# Patient Record
Sex: Male | Born: 1980 | Race: White | Hispanic: No | Marital: Married | Smoking: Never smoker
Health system: Southern US, Community
[De-identification: ages and names within clinical notes are randomized; demographics above are authoritative.]

## PROBLEM LIST (undated history)

## (undated) DIAGNOSIS — R701 Abnormal plasma viscosity: Secondary | ICD-10-CM

## (undated) DIAGNOSIS — Z8619 Personal history of other infectious and parasitic diseases: Secondary | ICD-10-CM

## (undated) DIAGNOSIS — N200 Calculus of kidney: Secondary | ICD-10-CM

## (undated) DIAGNOSIS — K760 Fatty (change of) liver, not elsewhere classified: Secondary | ICD-10-CM

## (undated) DIAGNOSIS — R17 Unspecified jaundice: Secondary | ICD-10-CM

## (undated) DIAGNOSIS — Z87442 Personal history of urinary calculi: Secondary | ICD-10-CM

## (undated) HISTORY — PX: WISDOM TOOTH EXTRACTION: SHX21

## (undated) HISTORY — DX: Calculus of kidney: N20.0

## (undated) HISTORY — DX: Abnormal plasma viscosity: R70.1

## (undated) HISTORY — DX: Personal history of other infectious and parasitic diseases: Z86.19

## (undated) HISTORY — DX: Fatty (change of) liver, not elsewhere classified: K76.0

## (undated) HISTORY — DX: Unspecified jaundice: R17

---

## 2008-02-09 HISTORY — PX: CYSTOSCOPY W/ URETEROSCOPY W/ LITHOTRIPSY: SUR380

## 2013-02-08 DIAGNOSIS — N200 Calculus of kidney: Secondary | ICD-10-CM

## 2013-02-08 HISTORY — DX: Calculus of kidney: N20.0

## 2013-07-05 ENCOUNTER — Emergency Department: Payer: Self-pay | Admitting: Emergency Medicine

## 2013-07-05 LAB — COMPREHENSIVE METABOLIC PANEL
Albumin: 4.5 g/dL (ref 3.4–5.0)
Alkaline Phosphatase: 81 U/L
Anion Gap: 8 (ref 7–16)
BUN: 12 mg/dL (ref 7–18)
Bilirubin,Total: 1.8 mg/dL — ABNORMAL HIGH (ref 0.2–1.0)
Calcium, Total: 9.6 mg/dL (ref 8.5–10.1)
Chloride: 104 mmol/L (ref 98–107)
Co2: 25 mmol/L (ref 21–32)
Creatinine: 0.87 mg/dL (ref 0.60–1.30)
EGFR (African American): >60
EGFR (Non-African Amer.): >60
Glucose: 115 mg/dL — ABNORMAL HIGH (ref 65–99)
Osmolality: 274 (ref 275–301)
Potassium: 4.1 mmol/L (ref 3.5–5.1)
SGOT(AST): 46 U/L — ABNORMAL HIGH (ref 15–37)
SGPT (ALT): 83 U/L — ABNORMAL HIGH (ref 12–78)
Sodium: 137 mmol/L (ref 136–145)
Total Protein: 8.2 g/dL (ref 6.4–8.2)

## 2013-07-05 LAB — URINALYSIS, COMPLETE
Bacteria: NONE SEEN
Bilirubin,UR: NEGATIVE
Glucose,UR: NEGATIVE mg/dL (ref 0–75)
Ketone: NEGATIVE
Leukocyte Esterase: NEGATIVE
Nitrite: NEGATIVE
Ph: 9 (ref 4.5–8.0)
Protein: 30
RBC,UR: 2464 /HPF (ref 0–5)
Specific Gravity: 1.018 (ref 1.003–1.030)
Squamous Epithelial: NONE SEEN
WBC UR: 6 /HPF (ref 0–5)

## 2013-07-05 LAB — CBC
HCT: 46.1 % (ref 40.0–52.0)
HGB: 15.3 g/dL (ref 13.0–18.0)
MCH: 29.6 pg (ref 26.0–34.0)
MCHC: 33.1 g/dL (ref 32.0–36.0)
MCV: 89 fL (ref 80–100)
Platelet: 207 10*3/uL (ref 150–440)
RBC: 5.17 10*6/uL (ref 4.40–5.90)
RDW: 13.5 % (ref 11.5–14.5)
WBC: 11.9 10*3/uL — ABNORMAL HIGH (ref 3.8–10.6)

## 2015-05-07 ENCOUNTER — Encounter: Payer: Self-pay | Admitting: Family Medicine

## 2015-05-07 ENCOUNTER — Ambulatory Visit (INDEPENDENT_AMBULATORY_CARE_PROVIDER_SITE_OTHER): Payer: 59 | Admitting: Family Medicine

## 2015-05-07 VITALS — BP 118/78 | HR 78 | Temp 97.9°F | Resp 18 | Ht 71.0 in | Wt 234.8 lb

## 2015-05-07 DIAGNOSIS — Z113 Encounter for screening for infections with a predominantly sexual mode of transmission: Secondary | ICD-10-CM | POA: Diagnosis not present

## 2015-05-07 DIAGNOSIS — E669 Obesity, unspecified: Secondary | ICD-10-CM

## 2015-05-07 DIAGNOSIS — N509 Disorder of male genital organs, unspecified: Secondary | ICD-10-CM | POA: Diagnosis not present

## 2015-05-07 DIAGNOSIS — Z0000 Encounter for general adult medical examination without abnormal findings: Secondary | ICD-10-CM | POA: Insufficient documentation

## 2015-05-07 DIAGNOSIS — Z23 Encounter for immunization: Secondary | ICD-10-CM | POA: Diagnosis not present

## 2015-05-07 DIAGNOSIS — R55 Syncope and collapse: Secondary | ICD-10-CM | POA: Diagnosis not present

## 2015-05-07 DIAGNOSIS — Z130 Encounter for screening for diseases of the blood and blood-forming organs and certain disorders involving the immune mechanism: Secondary | ICD-10-CM | POA: Diagnosis not present

## 2015-05-07 DIAGNOSIS — Z0001 Encounter for general adult medical examination with abnormal findings: Secondary | ICD-10-CM | POA: Insufficient documentation

## 2015-05-07 DIAGNOSIS — N5089 Other specified disorders of the male genital organs: Secondary | ICD-10-CM | POA: Insufficient documentation

## 2015-05-07 LAB — LIPID PANEL
Cholesterol: 214 mg/dL — ABNORMAL HIGH (ref 0–200)
HDL: 46.6 mg/dL (ref >39.00)
NonHDL: 167.51
Total CHOL/HDL Ratio: 5
Triglycerides: 213 mg/dL — ABNORMAL HIGH (ref 0.0–149.0)
VLDL: 42.6 mg/dL — ABNORMAL HIGH (ref 0.0–40.0)

## 2015-05-07 LAB — COMPREHENSIVE METABOLIC PANEL
ALT: 52 U/L (ref 0–53)
AST: 34 U/L (ref 0–37)
Albumin: 4.9 g/dL (ref 3.5–5.2)
Alkaline Phosphatase: 62 U/L (ref 39–117)
BUN: 12 mg/dL (ref 6–23)
CO2: 27 mEq/L (ref 19–32)
Calcium: 10.2 mg/dL (ref 8.4–10.5)
Chloride: 101 mEq/L (ref 96–112)
Creatinine, Ser: 0.91 mg/dL (ref 0.40–1.50)
GFR: 100.54 mL/min (ref >60.00)
Glucose, Bld: 89 mg/dL (ref 70–99)
Potassium: 4 mEq/L (ref 3.5–5.1)
Sodium: 136 mEq/L (ref 135–145)
Total Bilirubin: 1.7 mg/dL — ABNORMAL HIGH (ref 0.2–1.2)
Total Protein: 7.9 g/dL (ref 6.0–8.3)

## 2015-05-07 LAB — CBC
HCT: 43.7 % (ref 39.0–52.0)
Hemoglobin: 15 g/dL (ref 13.0–17.0)
MCHC: 34.3 g/dL (ref 30.0–36.0)
MCV: 86.3 fl (ref 78.0–100.0)
Platelets: 249 10*3/uL (ref 150.0–400.0)
RBC: 5.06 Mil/uL (ref 4.22–5.81)
RDW: 13.7 % (ref 11.5–15.5)
WBC: 7 10*3/uL (ref 4.0–10.5)

## 2015-05-07 LAB — LDL CHOLESTEROL, DIRECT: Direct LDL: 118 mg/dL

## 2015-05-07 NOTE — Patient Instructions (Addendum)
It was nice to see you today.  We will call with your lab results.  Good luck on the marriage.  Follow up:  Return in about 1 year (around 05/06/2016).  Take care  Dr. Adriana Simasook  Health Maintenance, Male A healthy lifestyle and preventative care can promote health and wellness.  Maintain regular health, dental, and eye exams.  Eat a healthy diet. Foods like vegetables, fruits, whole grains, low-fat dairy products, and lean protein foods contain the nutrients you need and are low in calories. Decrease your intake of foods high in solid fats, added sugars, and salt. Get information about a proper diet from your health care provider, if necessary.  Regular physical exercise is one of the most important things you can do for your health. Most adults should get at least 150 minutes of moderate-intensity exercise (any activity that increases your heart rate and causes you to sweat) each week. In addition, most adults need muscle-strengthening exercises on 2 or more days a week.   Maintain a healthy weight. The body mass index (BMI) is a screening tool to identify possible weight problems. It provides an estimate of body fat based on height and weight. Your health care provider can find your BMI and can help you achieve or maintain a healthy weight. For males 20 years and older:  A BMI below 18.5 is considered underweight.  A BMI of 18.5 to 24.9 is normal.  A BMI of 25 to 29.9 is considered overweight.  A BMI of 30 and above is considered obese.  Maintain normal blood lipids and cholesterol by exercising and minimizing your intake of saturated fat. Eat a balanced diet with plenty of fruits and vegetables. Blood tests for lipids and cholesterol should begin at age 36 and be repeated every 5 years. If your lipid or cholesterol levels are high, you are over age 36, or you are at high risk for heart disease, you may need your cholesterol levels checked more frequently.Ongoing high lipid and  cholesterol levels should be treated with medicines if diet and exercise are not working.  If you smoke, find out from your health care provider how to quit. If you do not use tobacco, do not start.  Lung cancer screening is recommended for adults aged 55-80 years who are at high risk for developing lung cancer because of a history of smoking. A yearly low-dose CT scan of the lungs is recommended for people who have at least a 30-pack-year history of smoking and are current smokers or have quit within the past 15 years. A pack year of smoking is smoking an average of 1 pack of cigarettes a day for 1 year (for example, a 30-pack-year history of smoking could mean smoking 1 pack a day for 30 years or 2 packs a day for 15 years). Yearly screening should continue until the smoker has stopped smoking for at least 15 years. Yearly screening should be stopped for people who develop a health problem that would prevent them from having lung cancer treatment.  If you choose to drink alcohol, do not have more than 2 drinks per day. One drink is considered to be 12 oz (360 mL) of beer, 5 oz (150 mL) of wine, or 1.5 oz (45 mL) of liquor.  Avoid the use of street drugs. Do not share needles with anyone. Ask for help if you need support or instructions about stopping the use of drugs.  High blood pressure causes heart disease and increases the risk of stroke.  High blood pressure is more likely to develop in:  People who have blood pressure in the end of the normal range (100-139/85-89 mm Hg).  People who are overweight or obese.  People who are African American.  If you are 53-75 years of age, have your blood pressure checked every 3-5 years. If you are 37 years of age or older, have your blood pressure checked every year. You should have your blood pressure measured twice--once when you are at a hospital or clinic, and once when you are not at a hospital or clinic. Record the average of the two measurements. To  check your blood pressure when you are not at a hospital or clinic, you can use:  An automated blood pressure machine at a pharmacy.  A home blood pressure monitor.  If you are 45-3 years old, ask your health care provider if you should take aspirin to prevent heart disease.  Diabetes screening involves taking a blood sample to check your fasting blood sugar level. This should be done once every 3 years after age 53 if you are at a normal weight and without risk factors for diabetes. Testing should be considered at a younger age or be carried out more frequently if you are overweight and have at least 1 risk factor for diabetes.  Colorectal cancer can be detected and often prevented. Most routine colorectal cancer screening begins at the age of 38 and continues through age 69. However, your health care provider may recommend screening at an earlier age if you have risk factors for colon cancer. On a yearly basis, your health care provider may provide home test kits to check for hidden blood in the stool. A small camera at the end of a tube may be used to directly examine the colon (sigmoidoscopy or colonoscopy) to detect the earliest forms of colorectal cancer. Talk to your health care provider about this at age 76 when routine screening begins. A direct exam of the colon should be repeated every 5-10 years through age 52, unless early forms of precancerous polyps or small growths are found.  People who are at an increased risk for hepatitis B should be screened for this virus. You are considered at high risk for hepatitis B if:  You were born in a country where hepatitis B occurs often. Talk with your health care provider about which countries are considered high risk.  Your parents were born in a high-risk country and you have not received a shot to protect against hepatitis B (hepatitis B vaccine).  You have HIV or AIDS.  You use needles to inject street drugs.  You live with, or have sex  with, someone who has hepatitis B.  You are a man who has sex with other men (MSM).  You get hemodialysis treatment.  You take certain medicines for conditions like cancer, organ transplantation, and autoimmune conditions.  Hepatitis C blood testing is recommended for all people born from [birth year] through 1965 and any individual with known risk factors for hepatitis C.  Healthy men should no longer receive prostate-specific antigen (PSA) blood tests as part of routine cancer screening. Talk to your health care provider about prostate cancer screening.  Testicular cancer screening is not recommended for adolescents or adult males who have no symptoms. Screening includes self-exam, a health care provider exam, and other screening tests. Consult with your health care provider about any symptoms you have or any concerns you have about testicular cancer.  Practice safe sex. Use condoms  and avoid high-risk sexual practices to reduce the spread of sexually transmitted infections (STIs).  You should be screened for STIs, including gonorrhea and chlamydia if:  You are sexually active and are younger than 24 years.  You are older than 24 years, and your health care provider tells you that you are at risk for this type of infection.  Your sexual activity has changed since you were last screened, and you are at an increased risk for chlamydia or gonorrhea. Ask your health care provider if you are at risk.  If you are at risk of being infected with HIV, it is recommended that you take a prescription medicine daily to prevent HIV infection. This is called pre-exposure prophylaxis (PrEP). You are considered at risk if:  You are a man who has sex with other men (MSM).  You are a heterosexual man who is sexually active with multiple partners.  You take drugs by injection.  You are sexually active with a partner who has HIV.  Talk with your health care provider about whether you are at high risk of being  infected with HIV. If you choose to begin PrEP, you should first be tested for HIV. You should then be tested every 3 months for as long as you are taking PrEP.  Use sunscreen. Apply sunscreen liberally and repeatedly throughout the day. You should seek shade when your shadow is shorter than you. Protect yourself by wearing long sleeves, pants, a wide-brimmed hat, and sunglasses year round whenever you are outdoors.  Tell your health care provider of new moles or changes in moles, especially if there is a change in shape or color. Also, tell your health care provider if a mole is larger than the size of a pencil eraser.  A one-time screening for abdominal aortic aneurysm (AAA) and surgical repair of large AAAs by ultrasound is recommended for men aged 25-75 years who are current or former smokers.  Stay current with your vaccines (immunizations).   This information is not intended to replace advice given to you by your health care provider. Make sure you discuss any questions you have with your health care provider.   Document Released: 07/24/2007 Document Revised: 02/15/2014 Document Reviewed: 06/22/2010 Elsevier Interactive Patient Education [employer].

## 2015-05-07 NOTE — Assessment & Plan Note (Signed)
Tdap given today. STD screening today - HIV, RPR, GC/Chlamydia. Declined flu.  CBC, CMP, Lipid panel today.

## 2015-05-07 NOTE — Progress Notes (Signed)
Pre visit review using our clinic review tool, if applicable. No additional management support is needed unless otherwise documented below in the visit note. 

## 2015-05-07 NOTE — Assessment & Plan Note (Signed)
New problem. Exam consistent with vasovagal syncope. No need for intervention at this time.

## 2015-05-07 NOTE — Progress Notes (Signed)
Subjective:  Patient ID: Billy Willis, male    DOB: 04/11/1980  Age: 35 y.o. MRN: [id number]  CC: Establish care; testicle lump; recent episode of passing out; STD testing  HPI Billy Willis is a 35 y.o. male presents to the clinic today to establish care. Other issues/concerns are outlined are below.  Preventative Healthcare.  Immunizations  Tetanus - In need of; will give today.  Pneumococcal - N/A.  Flu - Declines.  Prostate cancer screening: N/A.  Hepatitis C screening - N/A.  Labs: In need of labs today.  Exercise: No regular exercise.  Alcohol use: See below.   Smoking/tobacco use: Nonsmoker.  STD/HIV testing: Desires testing as he is getting married soon.  Wears seat belt: Yes.  STD testing  Patient desires testing as he is getting married next week.  No recent unprotected intercourse.  Lump in testicle  Noticed a lump in the left testicle 2 days ago.  No associated pain.  No known inciting factor.  Wants exam today.  Recent syncope  Last wed patient awoke in the night with rectal pain.  He went to the restroom to attempt to have a BM.  He then developed nausea.  He states that he subsequently "passed" out.  He awoke, got some water and went back to bed.  Has not happened again.  PMH, Surgical Hx, Family Hx, Social History reviewed and updated as below.  Past Medical History  Diagnosis Date  . Kidney stones 2015  . History of chicken pox    Past Surgical History  Procedure Laterality Date  . Wisdom tooth extraction     Family History  Problem Relation Age of Onset  . Cancer Mother     ovary/uterine cancer  . Mental illness Father   . Early death Father   . Arthritis Maternal Grandmother   . Arthritis Maternal Grandfather   . Stroke Paternal Grandmother   . Stroke Paternal Grandfather    Social History  Substance Use Topics  . Smoking status: Never Smoker   . Smokeless tobacco: Never Used  . Alcohol Use: 1.2 oz/week      1 Cans of beer, 1 Shots of liquor per week    Review of Systems  Gastrointestinal:       Heartburn.  Genitourinary:       Lump in testicle (left).  Skin:       Mole.  Neurological: Positive for syncope.  Psychiatric/Behavioral:       Sadness, anxiety, stress.  All other systems reviewed and are negative.  Objective:   Today's Vitals: BP 118/78 mmHg  Pulse 78  Temp(Src) 97.9 F (36.6 C) (Oral)  Resp 18  Ht  (1.803 m)  Wt 234 lb 12 oz (106.482 kg)  BMI 32.76 kg/m2  SpO2 97%  Physical Exam  Constitutional: He is oriented to person, place, and time. He appears well-developed. No distress.  HENT:  Head: Normocephalic and atraumatic.  Mouth/Throat: Oropharynx is clear and moist.  Eyes: Conjunctivae are normal. Pupils are equal, round, and reactive to light. No scleral icterus.  Neck: Neck supple.  Cardiovascular: Normal rate and regular rhythm.   Pulmonary/Chest: Effort normal. He has no wheezes. He has no rales.  Abdominal: Soft. He exhibits no distension. There is no tenderness.  Genitourinary:  Penis uncircumcised.  Scrotum/testicle - no appreciable masses.  Musculoskeletal: Normal range of motion. He exhibits no edema.  Neurological: He is alert and oriented to person, place, and time.  Skin: Skin is warm and dry.  Psychiatric: He has a normal mood and affect.  Vitals reviewed.  Assessment & Plan:   Problem List Items Addressed This Visit    Preventative health care - Primary    Tdap given today. STD screening today - HIV, RPR, GC/Chlamydia. Declined flu.  CBC, CMP, Lipid panel today.       Vasovagal syncope    New problem. Exam consistent with vasovagal syncope. No need for intervention at this time.      Lump in the testicle    Exam normal today.       Other Visit Diagnoses    Screening for deficiency anemia        Relevant Orders    CBC (Completed)    Screening for STDs (sexually transmitted diseases)        Relevant Orders     GC/Chlamydia Probe Amp    RPR    HIV antibody (with reflex)    Obesity (BMI 30.0-34.9)        Relevant Orders    Comprehensive metabolic panel (Completed)    Lipid Profile (Completed)    Direct LDL (Completed)      Follow-up: Return in about 1 year (around 05/06/2016).  Everlene OtherJayce Yevonne Yokum DO [facility] [place]

## 2015-05-07 NOTE — Assessment & Plan Note (Signed)
Exam normal today.

## 2015-05-08 LAB — GC/CHLAMYDIA PROBE AMP
CT Probe RNA: NOT DETECTED
GC Probe RNA: NOT DETECTED

## 2015-05-08 LAB — RPR

## 2015-05-08 LAB — HIV ANTIBODY (ROUTINE TESTING W REFLEX): HIV 1&2 Ab, 4th Generation: NONREACTIVE

## 2015-07-15 ENCOUNTER — Telehealth: Payer: Self-pay | Admitting: Family Medicine

## 2015-07-15 NOTE — Telephone Encounter (Signed)
appt tomorrow with PCP. thanks

## 2015-07-15 NOTE — Telephone Encounter (Signed)
Will need to be seen or go to ED/urgent care.

## 2015-07-15 NOTE — Telephone Encounter (Signed)
TELEPHONE ADVICE RECORD [facility]  Patient Name: Billy Willis  DOB: 10/26/1980    Initial Comment Caller states he is passing kidney stone, running out of pain meds, will have to ask for Samie when calling back   Nurse Assessment  Nurse: Elwyn LadeBurress, RN, Misty StanleyLisa Date/Time (Eastern Time): 07/15/2015 2:04:51 PM  Confirm and document reason for call. If symptomatic, describe symptoms. You must click the next button to save text entered. ---Caller states he is passing kidney stone, running out of pain meds with pain in kidney area, blood in urine; sx x 1 wk  Has the patient traveled out of the country within the last 30 days? ---Not Applicable  Does the patient have any new or worsening symptoms? ---Yes  Will a triage be completed? ---Yes  Related visit to physician within the last 2 weeks? ---No  Does the PT have any chronic conditions? (i.e. diabetes, asthma, etc.) ---No  Is this a behavioral health or substance abuse call? ---No     Guidelines    Guideline Title Affirmed Question Affirmed Notes  Flank Pain Blood in urine (red, pink, or tea-colored)    Final Disposition User   See Physician within 24 Hours Burress, RN, Misty StanleyLisa    Referrals  REFERRED TO PCP OFFICE   Disagree/Comply: Danella Maiersomply

## 2015-07-16 ENCOUNTER — Ambulatory Visit (INDEPENDENT_AMBULATORY_CARE_PROVIDER_SITE_OTHER): Payer: 59 | Admitting: Family Medicine

## 2015-07-16 ENCOUNTER — Telehealth: Payer: Self-pay | Admitting: Family Medicine

## 2015-07-16 ENCOUNTER — Telehealth: Payer: Self-pay | Admitting: *Deleted

## 2015-07-16 ENCOUNTER — Encounter: Payer: Self-pay | Admitting: Family Medicine

## 2015-07-16 ENCOUNTER — Ambulatory Visit (INDEPENDENT_AMBULATORY_CARE_PROVIDER_SITE_OTHER): Payer: 59

## 2015-07-16 VITALS — BP 136/84 | HR 71 | Temp 98.2°F | Ht 71.0 in | Wt 244.2 lb

## 2015-07-16 DIAGNOSIS — R10A2 Flank pain, left side: Secondary | ICD-10-CM | POA: Insufficient documentation

## 2015-07-16 DIAGNOSIS — R109 Unspecified abdominal pain: Secondary | ICD-10-CM | POA: Diagnosis not present

## 2015-07-16 LAB — POCT URINALYSIS DIPSTICK
Bilirubin, UA: NEGATIVE
Glucose, UA: NEGATIVE
Ketones, UA: NEGATIVE
Leukocytes, UA: NEGATIVE
Nitrite, UA: NEGATIVE
Protein, UA: NEGATIVE
Spec Grav, UA: <=1.005
Urobilinogen, UA: 0.2
pH, UA: 6.5

## 2015-07-16 LAB — BASIC METABOLIC PANEL
BUN: 13 mg/dL (ref 6–23)
CO2: 28 mEq/L (ref 19–32)
Calcium: 9.5 mg/dL (ref 8.4–10.5)
Chloride: 101 mEq/L (ref 96–112)
Creatinine, Ser: 0.96 mg/dL (ref 0.40–1.50)
GFR: 94.42 mL/min (ref >60.00)
Glucose, Bld: 84 mg/dL (ref 70–99)
Potassium: 4.2 mEq/L (ref 3.5–5.1)
Sodium: 136 mEq/L (ref 135–145)

## 2015-07-16 MED ORDER — OXYCODONE-ACETAMINOPHEN 5-325 MG PO TABS: 1.0000 | ORAL_TABLET | Freq: Three times a day (TID) | ORAL | Status: DC | PRN

## 2015-07-16 MED ORDER — TAMSULOSIN HCL 0.4 MG PO CAPS: 0.4000 mg | ORAL_CAPSULE | Freq: Every day | ORAL | Status: DC

## 2015-07-16 NOTE — Telephone Encounter (Signed)
Patient informed with results. Gave verbal understanding.

## 2015-07-16 NOTE — Progress Notes (Signed)
Subjective:  Patient ID: Starleen ArmsFredric Edwards, male    DOB: 07/14/1980  Age: 35 y.o. MRN: [id number]  CC: Flank pain  HPI:  35 year old male with a history of kidney stones presents with left flank pain.  Patient reports a one-week history of left flank pain/back pain. He states the pain has been intermittently severe. He states that the pain is currently 2/10 in severity. He's had some radiation anteriorly. No reports of gross hematuria. He's been taking some leftover oxycodone and Toradol that he had from his prior stone. He states that he's had some improvement with this. No known exacerbating factors. No associated fevers or chills. No other complaints at this time.  Social Hx   Social History   Social History  . Marital Status: Single    Spouse Name: N/A  . Number of Children: N/A  . Years of Education: N/A   Social History Main Topics  . Smoking status: Never Smoker   . Smokeless tobacco: Never Used  . Alcohol Use: 1.2 oz/week    1 Cans of beer, 1 Shots of liquor per week  . Drug Use: No  . Sexual Activity: Not Currently    Birth Control/ Protection: Abstinence     Comment: not for a long time, had unprotected sex 15 years ago   Other Topics Concern  . None   Social History Narrative   Review of Systems  Constitutional: Negative.   Genitourinary: Positive for flank pain.   Objective:  BP 136/84 mmHg  Pulse 71  Temp(Src) 98.2 F (36.8 C) (Oral)  Ht 5\' 11"  (1.803 m)  Wt 244 lb 4 oz (110.791 kg)  BMI 34.08 kg/m2  SpO2 98%  BP/Weight 07/16/2015 05/07/2015  Systolic BP 136 118  Diastolic BP 84 78  Wt. (Lbs) 244.25 234.75  BMI 34.08 32.76   Physical Exam  Constitutional: He is oriented to person, place, and time. He appears well-developed. No distress.  Cardiovascular: Normal rate and regular rhythm.   Pulmonary/Chest: Effort normal. He has no wheezes. He has no rales.  Abdominal: Soft. He exhibits no distension. There is no tenderness.  Neurological: He is alert  and oriented to person, place, and time.  Psychiatric: He has a normal mood and affect.  Vitals reviewed.  Lab Results  Component Value Date   WBC 7.0 05/07/2015   HGB 15.0 05/07/2015   HCT 43.7 05/07/2015   PLT 249.0 05/07/2015   GLUCOSE 89 05/07/2015   CHOL 214* 05/07/2015   TRIG 213.0* 05/07/2015   HDL 46.60 05/07/2015   LDLDIRECT 118.0 05/07/2015   ALT 52 05/07/2015   AST 34 05/07/2015   NA 136 05/07/2015   K 4.0 05/07/2015   CL 101 05/07/2015   CREATININE 0.91 05/07/2015   BUN 12 05/07/2015   CO2 27 05/07/2015    Assessment & Plan:   Problem List Items Addressed This Visit    Left flank pain - Primary    New acute problem (I have never seen patient for this). History and urinalysis suggestive of kidney stone. Obtaining KUB. Treating with Flomax, Percocet. Advised increased fluid intake.      Relevant Orders   POCT Urinalysis Dipstick (Completed)   DG Abd 1 View   Basic Metabolic Panel (BMET)      Meds ordered this encounter  Medications  . oxyCODONE-acetaminophen (ROXICET) 5-325 MG tablet    Sig: Take 1 tablet by mouth every 8 (eight) hours as needed for severe pain.    Dispense:  30 tablet  Refill:  0  . tamsulosin (FLOMAX) 0.4 MG CAPS capsule    Sig: Take 1 capsule (0.4 mg total) by mouth daily.    Dispense:  30 capsule    Refill:  3    Follow-up: PRN  Everlene Other DO [facility]

## 2015-07-16 NOTE — Telephone Encounter (Signed)
Patient was called with results. He gave verbal understanding.

## 2015-07-16 NOTE — Telephone Encounter (Signed)
Patient has requested lab results from 06-07 [phone]

## 2015-07-16 NOTE — Assessment & Plan Note (Signed)
New acute problem (I have never seen patient for this). History and urinalysis suggestive of kidney stone. Obtaining KUB. Treating with Flomax, Percocet. Advised increased fluid intake.

## 2015-07-16 NOTE — Telephone Encounter (Signed)
Pt was called to inform him of his x-ray results. He has a 2-3 mm stone that he should not have any trouble passing. Pt will need to take medication prescribed. He should not need a CT unless it does worsen.

## 2015-07-16 NOTE — Patient Instructions (Signed)
Take the pain medication as needed.  Take the Flomax daily.  We will call with your x-ray results.  Take care  Dr. Adriana Simasook  Kidney Stones Kidney stones (urolithiasis) are deposits that form inside your kidneys. The intense pain is caused by the stone moving through the urinary tract. When the stone moves, the ureter goes into spasm around the stone. The stone is usually passed in the urine.  CAUSES   A disorder that makes certain neck glands produce too much parathyroid hormone (primary hyperparathyroidism).  A buildup of uric acid crystals, similar to gout in your joints.  Narrowing (stricture) of the ureter.  A kidney obstruction present at birth (congenital obstruction).  Previous surgery on the kidney or ureters.  Numerous kidney infections. SYMPTOMS   Feeling sick to your stomach (nauseous).  Throwing up (vomiting).  Blood in the urine (hematuria).  Pain that usually spreads (radiates) to the groin.  Frequency or urgency of urination. DIAGNOSIS   Taking a history and physical exam.  Blood or urine tests.  CT scan.  Occasionally, an examination of the inside of the urinary bladder (cystoscopy) is performed. TREATMENT   Observation.  Increasing your fluid intake.  Extracorporeal shock wave lithotripsy--This is a noninvasive procedure that uses shock waves to break up kidney stones.  Surgery may be needed if you have severe pain or persistent obstruction. There are various surgical procedures. Most of the procedures are performed with the use of small instruments. Only small incisions are needed to accommodate these instruments, so recovery time is minimized. The size, location, and chemical composition are all important variables that will determine the proper choice of action for you. Talk to your health care provider to better understand your situation so that you will minimize the risk of injury to yourself and your kidney.  HOME CARE INSTRUCTIONS   Drink  enough water and fluids to keep your urine clear or pale yellow. This will help you to pass the stone or stone fragments.  Strain all urine through the provided strainer. Keep all particulate matter and stones for your health care provider to see. The stone causing the pain may be as small as a grain of salt. It is very important to use the strainer each and every time you pass your urine. The collection of your stone will allow your health care provider to analyze it and verify that a stone has actually passed. The stone analysis will often identify what you can do to reduce the incidence of recurrences.  Only take over-the-counter or prescription medicines for pain, discomfort, or fever as directed by your health care provider.  Keep all follow-up visits as told by your health care provider. This is important.  Get follow-up X-rays if required. The absence of pain does not always mean that the stone has passed. It may have only stopped moving. If the urine remains completely obstructed, it can cause loss of kidney function or even complete destruction of the kidney. It is your responsibility to make sure X-rays and follow-ups are completed. Ultrasounds of the kidney can show blockages and the status of the kidney. Ultrasounds are not associated with any radiation and can be performed easily in a matter of minutes.  Make changes to your daily diet as told by your health care provider. You may be told to:  Limit the amount of salt that you eat.  Eat 5 or more servings of fruits and vegetables each day.  Limit the amount of meat, poultry, fish, and  eggs that you eat.  Collect a 24-hour urine sample as told by your health care provider.You may need to collect another urine sample every 6-12 months. SEEK MEDICAL CARE IF:  You experience pain that is progressive and unresponsive to any pain medicine you have been prescribed. SEEK IMMEDIATE MEDICAL CARE IF:   Pain cannot be controlled with the  prescribed medicine.  You have a fever or shaking chills.  The severity or intensity of pain increases over 18 hours and is not relieved by pain medicine.  You develop a new onset of abdominal pain.  You feel faint or pass out.  You are unable to urinate.   This information is not intended to replace advice given to you by your health care provider. Make sure you discuss any questions you have with your health care provider.   Document Released: 01/25/2005 Document Revised: 10/16/2014 Document Reviewed: 06/28/2012 Elsevier Interactive Patient Education [employer]

## 2016-05-07 ENCOUNTER — Encounter: Payer: 59 | Admitting: Family Medicine

## 2016-05-10 ENCOUNTER — Ambulatory Visit (INDEPENDENT_AMBULATORY_CARE_PROVIDER_SITE_OTHER): Payer: 59 | Admitting: Family Medicine

## 2016-05-10 ENCOUNTER — Encounter: Payer: Self-pay | Admitting: Family Medicine

## 2016-05-10 DIAGNOSIS — Z0000 Encounter for general adult medical examination without abnormal findings: Secondary | ICD-10-CM | POA: Diagnosis not present

## 2016-05-10 NOTE — Progress Notes (Signed)
Pre visit review using our clinic review tool, if applicable. No additional management support is needed unless otherwise documented below in the visit note. 

## 2016-05-10 NOTE — Assessment & Plan Note (Signed)
I recommended that he go to the health department for his travel needs as we do not have typhoid vaccine here. Preventative healthcare otherwise up-to-date. He is doing well. No need for labs today.

## 2016-05-10 NOTE — Patient Instructions (Signed)
Contact the health dept regarding travel vaccines. [address], [place], [place] [address]   Follow up annually  Take care  Dr. Adriana Simas    Health Maintenance, Male A healthy lifestyle and preventive care is important for your health and wellness. Ask your health care provider about what schedule of regular examinations is right for you. What should I know about weight and diet?  Eat a Healthy Diet  Eat plenty of vegetables, fruits, whole grains, low-fat dairy products, and lean protein.  Do not eat a lot of foods high in solid fats, added sugars, or salt. Maintain a Healthy Weight  Regular exercise can help you achieve or maintain a healthy weight. You should:  Do at least 150 minutes of exercise each week. The exercise should increase your heart rate and make you sweat (moderate-intensity exercise).  Do strength-training exercises at least twice a week. Watch Your Levels of Cholesterol and Blood Lipids  Have your blood tested for lipids and cholesterol every 5 years starting at 37 years of age. If you are at high risk for heart disease, you should start having your blood tested when you are 37 years old. You may need to have your cholesterol levels checked more often if:  Your lipid or cholesterol levels are high.  You are older than 37 years of age.  You are at high risk for heart disease. What should I know about cancer screening? Many types of cancers can be detected early and may often be prevented. Lung Cancer  You should be screened every year for lung cancer if:  You are a current smoker who has smoked for at least 30 years.  You are a former smoker who has quit within the past 15 years.  Talk to your health care provider about your screening options, when you should start screening, and how often you should be screened. Colorectal Cancer  Routine colorectal cancer screening usually begins at 37 years of age and should be repeated every 5-10 years until  you are 37 years old. You may need to be screened more often if early forms of precancerous polyps or small growths are found. Your health care provider may recommend screening at an earlier age if you have risk factors for colon cancer.  Your health care provider may recommend using home test kits to check for hidden blood in the stool.  A small camera at the end of a tube can be used to examine your colon (sigmoidoscopy or colonoscopy). This checks for the earliest forms of colorectal cancer. Prostate and Testicular Cancer  Depending on your age and overall health, your health care provider may do certain tests to screen for prostate and testicular cancer.  Talk to your health care provider about any symptoms or concerns you have about testicular or prostate cancer. Skin Cancer  Check your skin from head to toe regularly.  Tell your health care provider about any new moles or changes in moles, especially if:  There is a change in a mole's size, shape, or color.  You have a mole that is larger than a pencil eraser.  Always use sunscreen. Apply sunscreen liberally and repeat throughout the day.  Protect yourself by wearing long sleeves, pants, a wide-brimmed hat, and sunglasses when outside. What should I know about heart disease, diabetes, and high blood pressure?  If you are 40-32 years of age, have your blood pressure checked every 3-5 years. If you are 62 years of age or older, have  your blood pressure checked every year. You should have your blood pressure measured twice-once when you are at a hospital or clinic, and once when you are not at a hospital or clinic. Record the average of the two measurements. To check your blood pressure when you are not at a hospital or clinic, you can use:  An automated blood pressure machine at a pharmacy.  A home blood pressure monitor.  Talk to your health care provider about your target blood pressure.  If you are between 83-47 years old, ask  your health care provider if you should take aspirin to prevent heart disease.  Have regular diabetes screenings by checking your fasting blood sugar level.  If you are at a normal weight and have a low risk for diabetes, have this test once every three years after the age of 57.  If you are overweight and have a high risk for diabetes, consider being tested at a younger age or more often.  A one-time screening for abdominal aortic aneurysm (AAA) by ultrasound is recommended for men aged 65-75 years who are current or former smokers. What should I know about preventing infection? Hepatitis B  If you have a higher risk for hepatitis B, you should be screened for this virus. Talk with your health care provider to find out if you are at risk for hepatitis B infection. Hepatitis C  Blood testing is recommended for:  Everyone born from [birth year] through 1966.  Anyone with known risk factors for hepatitis C. Sexually Transmitted Diseases (STDs)  You should be screened each year for STDs including gonorrhea and chlamydia if:  You are sexually active and are younger than 37 years of age.  You are older than 37 years of age and your health care provider tells you that you are at risk for this type of infection.  Your sexual activity has changed since you were last screened and you are at an increased risk for chlamydia or gonorrhea. Ask your health care provider if you are at risk.  Talk with your health care provider about whether you are at high risk of being infected with HIV. Your health care provider may recommend a prescription medicine to help prevent HIV infection. What else can I do?  Schedule regular health, dental, and eye exams.  Stay current with your vaccines (immunizations).  Do not use any tobacco products, such as cigarettes, chewing tobacco, and e-cigarettes. If you need help quitting, ask your health care provider.  Limit alcohol intake to no more than 2 drinks per day. One  drink equals 12 ounces of beer, 5 ounces of wine, or 1 ounces of hard liquor.  Do not use street drugs.  Do not share needles.  Ask your health care provider for help if you need support or information about quitting drugs.  Tell your health care provider if you often feel depressed.  Tell your health care provider if you have ever been abused or do not feel safe at home. This information is not intended to replace advice given to you by your health care provider. Make sure you discuss any questions you have with your health care provider. Document Released: 07/24/2007 Document Revised: 09/24/2015 Document Reviewed: 10/29/2014 Elsevier Interactive Patient Education  2017 [employer].

## 2016-05-10 NOTE — Progress Notes (Signed)
Subjective:  Patient ID: Billy Willis, male    DOB: Jul 23, 1980  Age: 36 y.o. MRN: [id number]  CC: Annual physical  HPI Billy Willis is a 36 y.o. male presents to the clinic today for an annual physical exam.  Preventative Healthcare  Immunizations  Tetanus - Up to date.  Flu - Up to date.  Alcohol use: See below.  Smoking/tobacco use: Nonsmoker.  STD/HIV testing: Up to date.  Travelling to [place] later this year - Recommended vaccines: Hepatitis A, typhoid. Also recommended to have malaria prophylaxis. Would like to discuss today.  PMH, Surgical Hx, Family Hx, Social History reviewed and updated as below.  Past Medical History:  Diagnosis Date  . History of chicken pox   . Kidney stones 2015   Past Surgical History:  Procedure Laterality Date  . WISDOM TOOTH EXTRACTION     Family History  Problem Relation Age of Onset  . Cancer Mother     ovary/uterine cancer  . Mental illness Father   . Early death Father   . Arthritis Maternal Grandmother   . Arthritis Maternal Grandfather   . Stroke Paternal Grandmother   . Stroke Paternal Grandfather    Social History  Substance Use Topics  . Smoking status: Never Smoker  . Smokeless tobacco: Never Used  . Alcohol use 1.2 oz/week    1 Cans of beer, 1 Shots of liquor per week   Review of Systems General: Denies unexplained weight loss, fever. Skin: Denies new or changing mole, sore/wound that won't heal. ENT: Trouble hearing, ringing in the ears, sores in the mouth, hoarseness, trouble swallowing. Eyes: Denies trouble seeing/visual disturbance. Heart/CV: Denies chest pain, shortness of breath, edema, palpitations. Lungs/Resp: Denies cough, shortness of breath, hemoptysis. Abd/GI: Denies nausea, vomiting, diarrhea, constipation, abdominal pain, hematochezia, melena. GU: Denies dysuria, incontinence, hematuria, urinary frequency, difficulty starting/keeping stream, penile discharge, sexual difficulty, lump in  testicles. MSK: Denies joint pain/swelling, myalgias. Neuro: Denies headaches, weakness, numbness, dizziness, syncope. Psych: Denies sadness, anxiety, stress, memory difficulty. Endocrine: Denies polyuria and polydipsia.  Objective:   Today's Vitals: BP 130/70   Pulse 79   Temp 98.3 F (36.8 C) (Oral)   Ht  (1.803 m)   Wt 240 lb 8 oz (109.1 kg)   SpO2 95%   BMI 33.54 kg/m   Physical Exam  Constitutional: He is oriented to person, place, and time. He appears well-developed and well-nourished. No distress.  HENT:  Head: Normocephalic and atraumatic.  Nose: Nose normal.  Mouth/Throat: Oropharynx is clear and moist. No oropharyngeal exudate.  Normal TM's bilaterally.   Eyes: Conjunctivae are normal. No scleral icterus.  Neck: Neck supple.  Cardiovascular: Normal rate and regular rhythm.   No murmur heard. Pulmonary/Chest: Effort normal and breath sounds normal. He has no wheezes. He has no rales.  Abdominal: Soft. He exhibits no distension. There is no tenderness. There is no rebound and no guarding.  Musculoskeletal: Normal range of motion. He exhibits no edema.  Lymphadenopathy:    He has no cervical adenopathy.  Neurological: He is alert and oriented to person, place, and time.  Skin: Skin is warm and dry. No rash noted.  Psychiatric: He has a normal mood and affect.  Vitals reviewed.  Assessment & Plan:   Problem List Items Addressed This Visit    Annual physical exam    I recommended that he go to the health department for his travel needs as we do not have typhoid vaccine here. Preventative healthcare otherwise up-to-date. He  is doing well. No need for labs today.        Follow-up: Annually  Everlene Other DO [facility]

## 2016-05-12 ENCOUNTER — Encounter: Payer: Self-pay | Admitting: Family Medicine

## 2016-05-12 ENCOUNTER — Telehealth: Payer: Self-pay | Admitting: Family Medicine

## 2016-05-12 NOTE — Telephone Encounter (Signed)
Pt wife called wanting to get labs done. Orders needed please and thank you!  Call pt @ [phone].

## 2016-05-12 NOTE — Telephone Encounter (Signed)
Spouse stated that she was expecting him to have routine lab work done when he was here for his appt.

## 2016-05-12 NOTE — Telephone Encounter (Signed)
Pt spouse called and stated that [facility] department does not do not do any travel vaccine, they were advised to go to [facility] health department for those vaccine. Except they do not take there insurance and they are paying out of pocket for the typhoid vaccine but they still need the Hep A vaccine. Can they do that here. Please advise, thank you!  Call pt @ [phone]

## 2016-05-12 NOTE — Telephone Encounter (Signed)
LVTCB. What labs is patient needing. He was just seen on 05/10/16.

## 2016-05-13 ENCOUNTER — Other Ambulatory Visit: Payer: Self-pay | Admitting: Family Medicine

## 2016-05-13 DIAGNOSIS — Z0000 Encounter for general adult medical examination without abnormal findings: Secondary | ICD-10-CM

## 2016-05-13 NOTE — Telephone Encounter (Signed)
He can return for his blood work (annual at the request of his wife). He can also return for a hep a vaccine

## 2016-05-13 NOTE — Telephone Encounter (Signed)
Can a nurse visit be done?

## 2016-05-13 NOTE — Telephone Encounter (Signed)
Please call pt and schedule both nurse visit and lab appt.

## 2016-05-13 NOTE — Telephone Encounter (Signed)
Per DPR pt wife was called and stated she would like for him to have labs.

## 2016-05-13 NOTE — Telephone Encounter (Signed)
He needs a Hep A titer (if low/negative) he can have vaccine here.

## 2016-05-13 NOTE — Telephone Encounter (Signed)
Per DPR pt wife was called and told about pt needing a titer she stated that given when they were born Hep A was not required so she knows that he would not have been vaccinated. She stated that she wanted to know how much the lab would cost. The number for Billing was given to her. Please advise lab work for Hep A needed?

## 2016-05-13 NOTE — Telephone Encounter (Signed)
Wife called. Please place lab orders for pt.

## 2016-05-20 ENCOUNTER — Ambulatory Visit (INDEPENDENT_AMBULATORY_CARE_PROVIDER_SITE_OTHER): Payer: 59 | Admitting: *Deleted

## 2016-05-20 ENCOUNTER — Other Ambulatory Visit (INDEPENDENT_AMBULATORY_CARE_PROVIDER_SITE_OTHER): Payer: 59

## 2016-05-20 DIAGNOSIS — Z0000 Encounter for general adult medical examination without abnormal findings: Secondary | ICD-10-CM | POA: Diagnosis not present

## 2016-05-20 DIAGNOSIS — Z23 Encounter for immunization: Secondary | ICD-10-CM

## 2016-05-20 NOTE — Progress Notes (Signed)
.  IMM

## 2016-05-20 NOTE — Addendum Note (Signed)
Addended by: Penne Lash on: 05/20/2016 02:55 PM   Modules accepted: Orders

## 2016-05-26 ENCOUNTER — Other Ambulatory Visit (INDEPENDENT_AMBULATORY_CARE_PROVIDER_SITE_OTHER): Payer: 59

## 2016-05-26 DIAGNOSIS — Z0000 Encounter for general adult medical examination without abnormal findings: Secondary | ICD-10-CM

## 2016-05-26 LAB — COMPREHENSIVE METABOLIC PANEL
ALT: 56 U/L — ABNORMAL HIGH (ref 9–46)
AST: 31 U/L (ref 10–40)
Albumin: 4.6 g/dL (ref 3.6–5.1)
Alkaline Phosphatase: 64 U/L (ref 40–115)
BUN: 12 mg/dL (ref 7–25)
CO2: 19 mmol/L — ABNORMAL LOW (ref 20–31)
Calcium: 9.7 mg/dL (ref 8.6–10.3)
Chloride: 103 mmol/L (ref 98–110)
Creat: 1 mg/dL (ref 0.60–1.35)
Glucose, Bld: 91 mg/dL (ref 65–99)
Potassium: 4.3 mmol/L (ref 3.5–5.3)
Sodium: 138 mmol/L (ref 135–146)
Total Bilirubin: 1.1 mg/dL (ref 0.2–1.2)
Total Protein: 7.2 g/dL (ref 6.1–8.1)

## 2016-05-26 LAB — LIPID PANEL
Cholesterol: 187 mg/dL (ref <200)
HDL: 44 mg/dL (ref >40)
LDL Cholesterol: 115 mg/dL — ABNORMAL HIGH (ref <100)
Total CHOL/HDL Ratio: 4.3 Ratio (ref <5.0)
Triglycerides: 142 mg/dL (ref <150)
VLDL: 28 mg/dL (ref <30)

## 2016-05-26 LAB — CBC
HCT: 44.7 % (ref 39.0–52.0)
Hemoglobin: 15.2 g/dL (ref 13.0–17.0)
MCHC: 33.9 g/dL (ref 30.0–36.0)
MCV: 87.4 fl (ref 78.0–100.0)
Platelets: 210 10*3/uL (ref 150.0–400.0)
RBC: 5.11 Mil/uL (ref 4.22–5.81)
RDW: 13.6 % (ref 11.5–15.5)
WBC: 6.4 10*3/uL (ref 4.0–10.5)

## 2016-05-27 ENCOUNTER — Other Ambulatory Visit: Payer: Self-pay | Admitting: Family Medicine

## 2016-05-28 ENCOUNTER — Telehealth: Payer: Self-pay | Admitting: Family Medicine

## 2016-05-28 ENCOUNTER — Other Ambulatory Visit: Payer: Self-pay | Admitting: Family Medicine

## 2016-05-28 DIAGNOSIS — R7989 Other specified abnormal findings of blood chemistry: Secondary | ICD-10-CM

## 2016-05-28 DIAGNOSIS — R945 Abnormal results of liver function studies: Principal | ICD-10-CM

## 2016-05-28 NOTE — Telephone Encounter (Signed)
pts wife was called and told results were on mychart with Dr.Cook's comments.

## 2016-05-28 NOTE — Telephone Encounter (Signed)
Pt wife called and stated that the lab results were not on mychart. Can we re-release them to mychart. Thank you!

## 2016-06-24 ENCOUNTER — Encounter: Payer: Self-pay | Admitting: Family Medicine

## 2016-06-28 ENCOUNTER — Other Ambulatory Visit: Payer: 59

## 2016-07-02 ENCOUNTER — Telehealth: Payer: Self-pay | Admitting: *Deleted

## 2016-07-02 NOTE — Telephone Encounter (Signed)
Please advise in Dr. Patsey Bertholdook's absence, thanks

## 2016-07-02 NOTE — Telephone Encounter (Signed)
Patients wife advised of below and verbalized understanding.   They will go to urgent care for evaluation for kidney stone.   They are wanting to come in for appointment for pain management kidney stone.  Are you willing to write for ? Please advise.

## 2016-07-02 NOTE — Telephone Encounter (Signed)
Patient recently just passed a kidney stone, wife questioned  if the stone would be needed in this office for analysis. Patient feels he's about to pass another stone and requested a medication refill for Oxycodone  [facility] [address]  Please contact Victorino DikeJennifer [phone]

## 2016-07-02 NOTE — Telephone Encounter (Signed)
Patient would need to be evaluated to consider medication such as oxycodone for this issue. Please advise him to be seen at the walk-in clinic urgent care for his kidney stone. I do not believe we need the stone for analysis at this time.

## 2016-11-17 ENCOUNTER — Ambulatory Visit: Payer: 59

## 2016-12-09 ENCOUNTER — Ambulatory Visit (INDEPENDENT_AMBULATORY_CARE_PROVIDER_SITE_OTHER): Payer: 59 | Admitting: *Deleted

## 2016-12-09 DIAGNOSIS — Z23 Encounter for immunization: Secondary | ICD-10-CM | POA: Diagnosis not present

## 2016-12-09 NOTE — Progress Notes (Signed)
Patient presented for second dose of Hep A, tolerated injection well.

## 2016-12-27 ENCOUNTER — Telehealth: Payer: Self-pay | Admitting: Family Medicine

## 2016-12-27 NOTE — Telephone Encounter (Signed)
Copied from CRM #[id number]. >> Dec 27, 2016  2:34 PM Raquel SarnaHayes, Teresa G wrote: Dr. Adriana Simasook former Pt.  Wife called stating she is upset that there is not a male doctor to take Dr. Patsey Bertholdook's pt's.

## 2016-12-29 NOTE — Telephone Encounter (Signed)
Made patient appt with Dr. Birdie SonsSonnenberg

## 2017-04-11 ENCOUNTER — Encounter: Payer: Self-pay | Admitting: Family Medicine

## 2017-04-11 DIAGNOSIS — Z0289 Encounter for other administrative examinations: Secondary | ICD-10-CM

## 2017-05-24 ENCOUNTER — Ambulatory Visit (INDEPENDENT_AMBULATORY_CARE_PROVIDER_SITE_OTHER): Payer: 59 | Admitting: Family Medicine

## 2017-05-24 ENCOUNTER — Other Ambulatory Visit: Payer: Self-pay

## 2017-05-24 ENCOUNTER — Encounter: Payer: Self-pay | Admitting: Family Medicine

## 2017-05-24 VITALS — BP 118/78 | HR 74 | Temp 98.0°F | Wt 246.4 lb

## 2017-05-24 DIAGNOSIS — Z13220 Encounter for screening for lipoid disorders: Secondary | ICD-10-CM

## 2017-05-24 DIAGNOSIS — L298 Other pruritus: Secondary | ICD-10-CM

## 2017-05-24 DIAGNOSIS — E6609 Other obesity due to excess calories: Secondary | ICD-10-CM | POA: Diagnosis not present

## 2017-05-24 DIAGNOSIS — Z6834 Body mass index (BMI) 34.0-34.9, adult: Secondary | ICD-10-CM | POA: Diagnosis not present

## 2017-05-24 DIAGNOSIS — H9203 Otalgia, bilateral: Secondary | ICD-10-CM

## 2017-05-24 DIAGNOSIS — N509 Disorder of male genital organs, unspecified: Secondary | ICD-10-CM | POA: Diagnosis not present

## 2017-05-24 DIAGNOSIS — R5383 Other fatigue: Secondary | ICD-10-CM

## 2017-05-24 DIAGNOSIS — Z0001 Encounter for general adult medical examination with abnormal findings: Secondary | ICD-10-CM

## 2017-05-24 DIAGNOSIS — B356 Tinea cruris: Secondary | ICD-10-CM

## 2017-05-24 NOTE — Assessment & Plan Note (Signed)
Physical exam completed.  He will return for lab work.  He will work on dietary changes and exercise.

## 2017-05-24 NOTE — Assessment & Plan Note (Signed)
No significant findings on exam today.  He can try over-the-counter jock itch medication and if not beneficial let us know.

## 2017-05-24 NOTE — Assessment & Plan Note (Signed)
Discussed decreased energy level could be related to his testosterone though could be related to a number of other issues.  We will obtain lab work as outlined below at a future date prior to 10 in the morning so the testosterone will be accurate.  Discussed if his testosterone was low it would require repeat test and additional testing to determine a specific cause.  If found to be low I advised it would require referral to a specialist for management of his testosterone supplementation.

## 2017-05-24 NOTE — Progress Notes (Signed)
Tommi Rumps, MD Phone: [phone]  Billy Willis is a 37 y.o. male who presents today for cpe.  Not exercising though does like to lift weights at times. Eating fairly healthfully.  Does have 2 cans of soda per day. Tetanus vaccination and HIV testing up-to-date. No family history of colon cancer or prostate cancer. No tobacco use or illicit drug use.  3-4 alcoholic beverages a week.  Notes does occasionally have some jock itch near his right scrotum.  Has been more persistent.  No rash.  It does itch.  Does have a history of a lump in his testicle and he had an ultrasound when he lived in [place].  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  He has not noticed any lump since then.  Does note they advised him he had a hernia.  He notes bilateral ear pain right greater than left that occurs 1-2 times a month.  It is a throbbing buildup of pressure.  It releases when he puts pressure on his ear though builds back up.  Last 12-24 hours.  No congestion, drainage, hearing loss, or medications for this.  Occasional ringing in his ears.  Patient notes for a couple of years he has felt a little lethargic and had decreased energy and drive.  Some decreased interest.  Some minimal depression.  Some anxiety with his job. No SI.  He is able to get erections.  He wonders if it is related to his testosterone.  Active Ambulatory Problems    Diagnosis Date Noted  . Encounter for general adult medical examination with abnormal findings 05/07/2015  . Decreased energy 05/24/2017  . Scrotal lesion 05/24/2017  . Ear pain, bilateral 05/24/2017  . Jock itch 05/24/2017   Resolved Ambulatory Problems    Diagnosis Date Noted  . Vasovagal syncope 05/07/2015  . Lump in the testicle 05/07/2015  . Left flank pain 07/16/2015   Past Medical History:  Diagnosis Date  . History of chicken pox   . Kidney stones 2015    Family History  Problem Relation Age of Onset  . Cancer Mother        ovary/uterine  cancer  . Mental illness Father   . Early death Father   . Arthritis Maternal Grandmother   . Arthritis Maternal Grandfather   . Stroke Paternal Grandmother   . Stroke Paternal Grandfather     Social History   Socioeconomic History  . Marital status: Married    Spouse name: Not on file  . Number of children: Not on file  . Years of education: Not on file  . Highest education level: Not on file  Occupational History  . Not on file  Social Needs  . Financial resource strain: Not on file  . Food insecurity:    Worry: Not on file    Inability: Not on file  . Transportation needs:    Medical: Not on file    Non-medical: Not on file  Tobacco Use  . Smoking status: Never Smoker  . Smokeless tobacco: Never Used  Substance and Sexual Activity  . Alcohol use: Yes    Alcohol/week: 1.2 oz    Types: 1 Cans of beer, 1 Shots of liquor per week  . Drug use: No  . Sexual activity: Not Currently    Birth control/protection: Abstinence    Comment: not for a long time, had unprotected sex 15 years ago  Lifestyle  . Physical activity:    Days per week: Not on file    Minutes  per session: Not on file  . Stress: Not on file  Relationships  . Social connections:    Talks on phone: Not on file    Gets together: Not on file    Attends religious service: Not on file    Active member of club or organization: Not on file    Attends meetings of clubs or organizations: Not on file    Relationship status: Not on file  . Intimate partner violence:    Fear of current or ex partner: Not on file    Emotionally abused: Not on file    Physically abused: Not on file    Forced sexual activity: Not on file  Other Topics Concern  . Not on file  Social History Narrative  . Not on file    ROS - see HPI  Objective  Physical Exam Vitals:   05/24/17 1001  BP: 118/78  Pulse: 74  Temp: 98 F (36.7 C)  SpO2: 97%    BP Readings from Last 3 Encounters:  05/24/17 118/78  05/10/16 130/70    07/16/15 136/84   Wt Readings from Last 3 Encounters:  05/24/17 246 lb 6.4 oz (111.8 kg)  05/10/16 240 lb 8 oz (109.1 kg)  07/16/15 244 lb 4 oz (110.8 kg)    Physical Exam  Constitutional: No distress.  HENT:  Head: Normocephalic and atraumatic.  Mouth/Throat: Oropharynx is clear and moist.  Bilateral ears with no external abnormalities, ear canal abnormalities, or tympanic membrane abnormalities  Eyes: Pupils are equal, round, and reactive to light. Conjunctivae are normal.  Neck: Neck supple.  Cardiovascular: Normal rate, regular rhythm and normal heart sounds.  Pulmonary/Chest: Effort normal and breath sounds normal.  Abdominal: Soft. Bowel sounds are normal. He exhibits no distension and no mass. There is no tenderness. There is no rebound and no guarding.  Genitourinary:  Genitourinary Comments: Normal uncircumcised penis, normal scrotum, normal testicles, normal epididymis, there is a soft tissue fullness along the course of the vas deferens on the left slightly greater than the right that is nontender, no inguinal hernias palpated, there is slight excoriation in the right lower groin with no noted rash  Musculoskeletal: He exhibits no edema.  Lymphadenopathy:    He has no cervical adenopathy.  Neurological: He is alert.  Skin: Skin is warm and dry. He is not diaphoretic.  Psychiatric: He has a normal mood and affect.     Assessment/Plan:   Encounter for general adult medical examination with abnormal findings Physical exam completed.  He will return for lab work.  He will work on dietary changes and exercise.  Scrotal lesion Unsure of the cause of this soft tissue lesion along the course of his vas deferens.  Discussed potential for benign causes though would need to obtain an ultrasound to rule out other causes.  Ultrasound has been ordered and will be scheduled.  Decreased energy Discussed decreased energy level could be related to his testosterone though could be  related to a number of other issues.  We will obtain lab work as outlined below at a future date prior to 10 in the morning so the testosterone will be accurate.  Discussed if his testosterone was low it would require repeat test and additional testing to determine a specific cause.  If found to be low I advised it would require referral to a specialist for management of his testosterone supplementation.  Ear pain, bilateral Right greater than left.  No abnormalities on exam.  Could be eustachian  tube dysfunction.  He will trial Claritin or Zyrtec when this occurs.  If that does not help he will try to be seen in the office while this is occurring.  Jock itch No significant findings on exam today.  He can try over-the-counter jock itch medication and if not beneficial let us know.   Orders Placed This Encounter  Procedures  . US SCROTUM W/DOPPLER    Standing Status:   Future    Standing Expiration Date:   05/25/2018    Order Specific Question:   Reason for Exam (SYMPTOM  OR DIAGNOSIS REQUIRED)    Answer:   soft tissue fullness superior to both testicles L>R, nontender    Order Specific Question:   Preferred imaging location?    Answer:   [facility]  . Testosterone    Standing Status:   Future    Standing Expiration Date:   05/25/2018  . Comp Met (CMET)    Standing Status:   Future    Standing Expiration Date:   05/25/2018  . Lipid panel    Standing Status:   Future    Standing Expiration Date:   05/25/2018  . HgB A1c    Standing Status:   Future    Standing Expiration Date:   05/25/2018  . TSH    Standing Status:   Future    Standing Expiration Date:   05/25/2018  . CBC w/Diff    Standing Status:   Future    Standing Expiration Date:   05/25/2018    No orders of the defined types were placed in this encounter.    Tommi Rumps, MD [place]

## 2017-05-24 NOTE — Assessment & Plan Note (Signed)
Unsure of the cause of this soft tissue lesion along the course of his vas deferens.  Discussed potential for benign causes though would need to obtain an ultrasound to rule out other causes.  Ultrasound has been ordered and will be scheduled.

## 2017-05-24 NOTE — Patient Instructions (Signed)
Nice to see you. We will get you set up for an ultrasound of your testicles. Please try Claritin or Zyrtec when you have an ear issue.  If this is not beneficial please come in and see us while your ear is bothering you. We will check lab work next week.  This should be done before 10 AM. Please work on diet and exercise.

## 2017-05-24 NOTE — Assessment & Plan Note (Signed)
Right greater than left.  No abnormalities on exam.  Could be eustachian tube dysfunction.  He will trial Claritin or Zyrtec when this occurs.  If that does not help he will try to be seen in the office while this is occurring.

## 2017-05-30 ENCOUNTER — Encounter (INDEPENDENT_AMBULATORY_CARE_PROVIDER_SITE_OTHER): Payer: Self-pay

## 2017-05-31 ENCOUNTER — Other Ambulatory Visit: Payer: 59

## 2017-06-01 ENCOUNTER — Other Ambulatory Visit (INDEPENDENT_AMBULATORY_CARE_PROVIDER_SITE_OTHER): Payer: 59

## 2017-06-01 DIAGNOSIS — Z13220 Encounter for screening for lipoid disorders: Secondary | ICD-10-CM

## 2017-06-01 DIAGNOSIS — E6609 Other obesity due to excess calories: Secondary | ICD-10-CM

## 2017-06-01 DIAGNOSIS — R5383 Other fatigue: Secondary | ICD-10-CM

## 2017-06-01 DIAGNOSIS — Z6834 Body mass index (BMI) 34.0-34.9, adult: Secondary | ICD-10-CM

## 2017-06-01 LAB — HEMOGLOBIN A1C: Hgb A1c MFr Bld: 5.3 % (ref 4.6–6.5)

## 2017-06-01 LAB — TESTOSTERONE: Testosterone: 238.39 ng/dL — ABNORMAL LOW (ref 300.00–890.00)

## 2017-06-01 LAB — CBC WITH DIFFERENTIAL/PLATELET
Basophils Absolute: 0.1 10*3/uL (ref 0.0–0.1)
Basophils Relative: 0.7 % (ref 0.0–3.0)
Eosinophils Absolute: 0.3 10*3/uL (ref 0.0–0.7)
Eosinophils Relative: 3.6 % (ref 0.0–5.0)
HCT: 44.8 % (ref 39.0–52.0)
Hemoglobin: 15.6 g/dL (ref 13.0–17.0)
Lymphocytes Relative: 26.8 % (ref 12.0–46.0)
Lymphs Abs: 2 10*3/uL (ref 0.7–4.0)
MCHC: 34.8 g/dL (ref 30.0–36.0)
MCV: 87.3 fl (ref 78.0–100.0)
Monocytes Absolute: 0.7 10*3/uL (ref 0.1–1.0)
Monocytes Relative: 9.6 % (ref 3.0–12.0)
Neutro Abs: 4.4 10*3/uL (ref 1.4–7.7)
Neutrophils Relative %: 59.3 % (ref 43.0–77.0)
Platelets: 246 10*3/uL (ref 150.0–400.0)
RBC: 5.13 Mil/uL (ref 4.22–5.81)
RDW: 13.8 % (ref 11.5–15.5)
WBC: 7.5 10*3/uL (ref 4.0–10.5)

## 2017-06-01 LAB — COMPREHENSIVE METABOLIC PANEL
ALT: 45 U/L (ref 0–53)
AST: 27 U/L (ref 0–37)
Albumin: 4.7 g/dL (ref 3.5–5.2)
Alkaline Phosphatase: 65 U/L (ref 39–117)
BUN: 15 mg/dL (ref 6–23)
CO2: 28 mEq/L (ref 19–32)
Calcium: 10 mg/dL (ref 8.4–10.5)
Chloride: 100 mEq/L (ref 96–112)
Creatinine, Ser: 0.9 mg/dL (ref 0.40–1.50)
GFR: 100.66 mL/min (ref >60.00)
Glucose, Bld: 89 mg/dL (ref 70–99)
Potassium: 4.4 mEq/L (ref 3.5–5.1)
Sodium: 136 mEq/L (ref 135–145)
Total Bilirubin: 1.2 mg/dL (ref 0.2–1.2)
Total Protein: 7.3 g/dL (ref 6.0–8.3)

## 2017-06-01 LAB — LIPID PANEL
Cholesterol: 167 mg/dL (ref 0–200)
HDL: 45 mg/dL (ref >39.00)
LDL Cholesterol: 84 mg/dL (ref 0–99)
NonHDL: 122.17
Total CHOL/HDL Ratio: 4
Triglycerides: 192 mg/dL — ABNORMAL HIGH (ref 0.0–149.0)
VLDL: 38.4 mg/dL (ref 0.0–40.0)

## 2017-06-01 LAB — TSH: TSH: 1.64 u[IU]/mL (ref 0.35–4.50)

## 2017-06-02 ENCOUNTER — Ambulatory Visit: Payer: 59

## 2017-06-02 ENCOUNTER — Other Ambulatory Visit: Payer: Self-pay | Admitting: Family Medicine

## 2017-06-02 DIAGNOSIS — R7989 Other specified abnormal findings of blood chemistry: Secondary | ICD-10-CM

## 2017-06-07 ENCOUNTER — Ambulatory Visit
Admission: RE | Admit: 2017-06-07 | Discharge: 2017-06-07 | Disposition: A | Discharge status: Home / Self Care | Payer: 59 | Source: Ambulatory Visit | Attending: Family Medicine | Admitting: Family Medicine

## 2017-06-07 DIAGNOSIS — N509 Disorder of male genital organs, unspecified: Secondary | ICD-10-CM | POA: Insufficient documentation

## 2017-06-07 DIAGNOSIS — R93811 Abnormal radiologic findings on diagnostic imaging of right testicle: Secondary | ICD-10-CM | POA: Insufficient documentation

## 2017-06-14 ENCOUNTER — Other Ambulatory Visit (INDEPENDENT_AMBULATORY_CARE_PROVIDER_SITE_OTHER): Payer: 59

## 2017-06-14 DIAGNOSIS — R7989 Other specified abnormal findings of blood chemistry: Secondary | ICD-10-CM

## 2017-06-14 LAB — LUTEINIZING HORMONE: LH: 1.81 m[IU]/mL (ref 1.50–9.30)

## 2017-06-14 LAB — FOLLICLE STIMULATING HORMONE: FSH: 3.9 m[IU]/mL (ref 1.4–18.1)

## 2017-06-14 LAB — TESTOSTERONE: Testosterone: 270.07 ng/dL — ABNORMAL LOW (ref 300.00–890.00)

## 2017-06-16 ENCOUNTER — Other Ambulatory Visit: Payer: Self-pay | Admitting: Family Medicine

## 2017-06-16 DIAGNOSIS — K4020 Bilateral inguinal hernia, without obstruction or gangrene, not specified as recurrent: Secondary | ICD-10-CM

## 2017-06-24 ENCOUNTER — Telehealth: Payer: Self-pay | Admitting: Family Medicine

## 2017-06-24 ENCOUNTER — Other Ambulatory Visit: Payer: Self-pay | Admitting: Otolaryngology

## 2017-06-24 ENCOUNTER — Ambulatory Visit
Admission: RE | Admit: 2017-06-24 | Discharge: 2017-06-24 | Disposition: A | Discharge status: Home / Self Care | Payer: 59 | Source: Ambulatory Visit | Attending: Otolaryngology | Admitting: Otolaryngology

## 2017-06-24 DIAGNOSIS — R221 Localized swelling, mass and lump, neck: Secondary | ICD-10-CM

## 2017-06-24 DIAGNOSIS — L03221 Cellulitis of neck: Secondary | ICD-10-CM | POA: Insufficient documentation

## 2017-06-24 MED ORDER — IOPAMIDOL (ISOVUE-370) INJECTION 76%
75.0000 mL | Freq: Once | INTRAVENOUS | Status: AC | PRN
  Administered 2017-06-24: 75 mL via INTRAVENOUS

## 2017-06-24 NOTE — Telephone Encounter (Signed)
FYI

## 2017-06-24 NOTE — Telephone Encounter (Signed)
Patient notified of his lab results- he would like to have the endocrine referral.  Lab not in basket.

## 2017-06-26 ENCOUNTER — Other Ambulatory Visit: Payer: Self-pay | Admitting: Family Medicine

## 2017-06-26 DIAGNOSIS — E291 Testicular hypofunction: Secondary | ICD-10-CM

## 2017-07-01 ENCOUNTER — Encounter: Payer: Self-pay | Admitting: General Surgery

## 2017-08-17 ENCOUNTER — Encounter: Payer: Self-pay | Admitting: Endocrinology

## 2017-12-22 ENCOUNTER — Telehealth: Payer: Self-pay

## 2017-12-22 DIAGNOSIS — E291 Testicular hypofunction: Secondary | ICD-10-CM

## 2017-12-22 NOTE — Telephone Encounter (Signed)
Copied from CRM [id number]. Topic: Referral - Question >> Dec 22, 2017  2:11 PM Percival SpanishKennedy, Cheryl W wrote:  Pt call to say they [facility] [place]

## 2017-12-23 NOTE — Telephone Encounter (Signed)
[facility] Tried calling patient voicemail box full

## 2017-12-23 NOTE — Telephone Encounter (Signed)
Referral placed.

## 2017-12-27 NOTE — Telephone Encounter (Signed)
Patient advised and verbalized understnading

## 2018-05-26 ENCOUNTER — Telehealth: Payer: Self-pay

## 2018-05-26 NOTE — Telephone Encounter (Signed)
Copied from CRM [id number]. Topic: Quick Communication - Appointment Cancellation >> May 26, 2018 10:09 AM Maia Petties wrote: Patient wife called to cancel/reschedule appointment scheduled for 05/29/2018. No answer x 3.

## 2018-05-29 ENCOUNTER — Encounter: Payer: 59 | Admitting: Family Medicine

## 2018-06-20 ENCOUNTER — Other Ambulatory Visit: Payer: Self-pay | Admitting: "Endocrinology

## 2018-06-20 DIAGNOSIS — E221 Hyperprolactinemia: Secondary | ICD-10-CM

## 2018-07-04 ENCOUNTER — Other Ambulatory Visit: Payer: Self-pay

## 2018-07-04 ENCOUNTER — Ambulatory Visit
Admission: RE | Admit: 2018-07-04 | Discharge: 2018-07-04 | Disposition: A | Discharge status: Home / Self Care | Payer: 59 | Source: Ambulatory Visit | Attending: "Endocrinology | Admitting: "Endocrinology

## 2018-07-04 DIAGNOSIS — E221 Hyperprolactinemia: Secondary | ICD-10-CM

## 2018-07-04 MED ORDER — GADOBUTROL 1 MMOL/ML IV SOLN
10.0000 mL | Freq: Once | INTRAVENOUS | Status: AC | PRN
  Administered 2018-07-04: 10 mL via INTRAVENOUS

## 2018-08-21 DIAGNOSIS — E221 Hyperprolactinemia: Secondary | ICD-10-CM | POA: Diagnosis not present

## 2018-08-21 DIAGNOSIS — R7989 Other specified abnormal findings of blood chemistry: Secondary | ICD-10-CM | POA: Diagnosis not present

## 2018-08-23 DIAGNOSIS — R7989 Other specified abnormal findings of blood chemistry: Secondary | ICD-10-CM | POA: Diagnosis not present

## 2018-08-23 DIAGNOSIS — E221 Hyperprolactinemia: Secondary | ICD-10-CM | POA: Diagnosis not present

## 2018-09-14 ENCOUNTER — Telehealth: Payer: Self-pay

## 2018-09-14 NOTE — Telephone Encounter (Signed)
That is fine with me as long as they can pass the COVID19 screening questions.

## 2018-09-14 NOTE — Telephone Encounter (Signed)
Copied from [place] [id number]. Topic: General - Inquiry >> Sep 14, 2018 10:41 AM Virl Axe D wrote: Reason for CRM: Pt's wife Anderson Malta called to see if Dr. Caryl Bis would be okay with her attending appt with pt. Pt would like for her to be there. Please advise. CB#[phone]

## 2018-09-19 NOTE — Telephone Encounter (Signed)
According to protocol we are not allowing visitors in the building, unless patient is handicap physically or mentally.

## 2018-09-19 NOTE — Telephone Encounter (Signed)
Noted. Please let the patient know. Thanks

## 2018-09-19 NOTE — Telephone Encounter (Signed)
Left message to return call to office.

## 2018-09-19 NOTE — Telephone Encounter (Signed)
Patient's wife is returning a call to the office.  Please call back at [phone]

## 2018-09-20 NOTE — Telephone Encounter (Signed)
Would PCP be comfortable with husband using face time to have patient in visit.

## 2018-09-21 NOTE — Telephone Encounter (Signed)
That is fine with me.

## 2018-09-22 DIAGNOSIS — Z23 Encounter for immunization: Secondary | ICD-10-CM | POA: Diagnosis not present

## 2018-09-25 ENCOUNTER — Other Ambulatory Visit: Payer: Self-pay

## 2018-09-26 ENCOUNTER — Other Ambulatory Visit: Payer: Self-pay

## 2018-09-26 ENCOUNTER — Ambulatory Visit (INDEPENDENT_AMBULATORY_CARE_PROVIDER_SITE_OTHER): Payer: BC Managed Care – PPO | Admitting: Family Medicine

## 2018-09-26 ENCOUNTER — Encounter: Payer: Self-pay | Admitting: Family Medicine

## 2018-09-26 VITALS — BP 110/78 | HR 81 | Temp 98.6°F | Ht 70.0 in | Wt 250.8 lb

## 2018-09-26 DIAGNOSIS — Z0001 Encounter for general adult medical examination with abnormal findings: Secondary | ICD-10-CM | POA: Diagnosis not present

## 2018-09-26 DIAGNOSIS — R740 Nonspecific elevation of levels of transaminase and lactic acid dehydrogenase [LDH]: Secondary | ICD-10-CM

## 2018-09-26 DIAGNOSIS — H9203 Otalgia, bilateral: Secondary | ICD-10-CM | POA: Diagnosis not present

## 2018-09-26 DIAGNOSIS — R7989 Other specified abnormal findings of blood chemistry: Secondary | ICD-10-CM

## 2018-09-26 DIAGNOSIS — Z789 Other specified health status: Secondary | ICD-10-CM | POA: Diagnosis not present

## 2018-09-26 DIAGNOSIS — E291 Testicular hypofunction: Secondary | ICD-10-CM

## 2018-09-26 DIAGNOSIS — R195 Other fecal abnormalities: Secondary | ICD-10-CM | POA: Diagnosis not present

## 2018-09-26 DIAGNOSIS — R7401 Elevation of levels of liver transaminase levels: Secondary | ICD-10-CM | POA: Insufficient documentation

## 2018-09-26 DIAGNOSIS — R945 Abnormal results of liver function studies: Secondary | ICD-10-CM

## 2018-09-26 NOTE — Addendum Note (Signed)
Addended by: Elpidio Galea T on: 09/26/2018 10:15 AM   Modules accepted: Orders

## 2018-09-26 NOTE — Progress Notes (Signed)
Tommi Rumps, MD Phone: [phone]  Billy Willis is a 38 y.o. male who presents today for CPE.  Exercise: Walks 2 miles 2-3 times a week.  He likes to lift weights though cannot do that with the gym being closed. Diet: Lots of rice and meat.  Some vegetables.  Drinks one 20 ounce soda daily.  Does have some gummy snacks daily.  Eats Cheerios for breakfast.  Drinks lots of water. Flu vaccine and tetanus vaccine up-to-date. HIV screening up-to-date. No family history prostate cancer or colon cancer.  He does report his mother had breast cancer and ovarian cancer.  His maternal grandfather had some kind of cancer as well though he does not know specifically which kind.  Denies family history of melanoma.  He wonders about genetic screening for cancer.  He is unsure if his mother had genetic screening. No tobacco use or illicit drug use.  Has 1 alcoholic beverage weekly. Sees a dentist.  Due to see ophthalmology.  Hypogonadism: Currently on Clomid.  His energy is slightly improved.  He is shaving slightly more often.  He is due to have recheck of his testosterone in October through endocrinology.  Bilateral ear pain: Patient notes this continues to bother him.  This has been going on for quite some time.  They feel sore and will press on them or pop them and they will feel better.  Occurs twice monthly.  Feels a pressure in his ears.  Loose stools: Patient does note intermittent loose stools over the last several weeks.  No abdominal pain, blood in stool, or fevers.  Started about the time that he acclimated.  He does note his wife had similar symptoms for 1 day several weeks ago.  Elevated ALT: No abdominal pain.  No excessive alcohol use.  No excessive Tylenol use.  Active Ambulatory Problems    Diagnosis Date Noted  . Encounter for general adult medical examination with abnormal findings 05/07/2015  . Decreased energy 05/24/2017  . Scrotal lesion 05/24/2017  . Ear pain, bilateral  05/24/2017  . Jock itch 05/24/2017  . Hypogonadism in male 09/26/2018  . Loose stools 09/26/2018  . Elevated ALT measurement 09/26/2018   Resolved Ambulatory Problems    Diagnosis Date Noted  . Vasovagal syncope 05/07/2015  . Lump in the testicle 05/07/2015  . Left flank pain 07/16/2015   Past Medical History:  Diagnosis Date  . History of chicken pox   . Kidney stones 2015    Family History  Problem Relation Age of Onset  . Cancer Mother        ovary/uterine cancer  . Mental illness Father   . Early death Father   . Arthritis Maternal Grandmother   . Arthritis Maternal Grandfather   . Stroke Paternal Grandmother   . Stroke Paternal Grandfather     Social History   Socioeconomic History  . Marital status: Married    Spouse name: Not on file  . Number of children: Not on file  . Years of education: Not on file  . Highest education level: Not on file  Occupational History  . Not on file  Social Needs  . Financial resource strain: Not on file  . Food insecurity    Worry: Not on file    Inability: Not on file  . Transportation needs    Medical: Not on file    Non-medical: Not on file  Tobacco Use  . Smoking status: Never Smoker  . Smokeless tobacco: Never Used  Substance and  Sexual Activity  . Alcohol use: Yes    Alcohol/week: 2.0 standard drinks    Types: 1 Cans of beer, 1 Shots of liquor per week  . Drug use: No  . Sexual activity: Not Currently    Birth control/protection: Abstinence    Comment: not for a long time, had unprotected sex 15 years ago  Lifestyle  . Physical activity    Days per week: Not on file    Minutes per session: Not on file  . Stress: Not on file  Relationships  . Social Herbalist on phone: Not on file    Gets together: Not on file    Attends religious service: Not on file    Active member of club or organization: Not on file    Attends meetings of clubs or organizations: Not on file    Relationship status: Not on  file  . Intimate partner violence    Fear of current or ex partner: Not on file    Emotionally abused: Not on file    Physically abused: Not on file    Forced sexual activity: Not on file  Other Topics Concern  . Not on file  Social History Narrative  . Not on file    ROS  General:  Negative for nexplained weight loss, fever Skin: Negative for new or changing mole, sore that won't heal HEENT: Positive for chronic intermittent ear pain, negative for trouble hearing, trouble seeing, ringing in ears, mouth sores, hoarseness, change in voice, dysphagia. CV:  Negative for chest pain, dyspnea, edema, palpitations Resp: Negative for cough, dyspnea, hemoptysis GI: Positive for loose stools, negative for nausea, vomiting, diarrhea, constipation, abdominal pain, melena, hematochezia. GU: Negative for dysuria, incontinence, urinary hesitance, hematuria, vaginal or penile discharge, polyuria, sexual difficulty, lumps in testicle or breasts MSK: Negative for muscle cramps or aches, joint pain or swelling Neuro: Negative for headaches, weakness, numbness, dizziness, passing out/fainting Psych: Negative for depression, anxiety, memory problems  Objective  Physical Exam Vitals:   09/26/18 0915  BP: 110/78  Pulse: 81  Temp: 98.6 F (37 C)  SpO2: 97%    BP Readings from Last 3 Encounters:  09/26/18 110/78  05/24/17 118/78  05/10/16 130/70   Wt Readings from Last 3 Encounters:  09/26/18 250 lb 12.8 oz (113.8 kg)  05/24/17 246 lb 6.4 oz (111.8 kg)  05/10/16 240 lb 8 oz (109.1 kg)    Physical Exam Constitutional:      General: He is not in acute distress.    Appearance: He is not diaphoretic.  HENT:     Head: Normocephalic and atraumatic.     Right Ear: Tympanic membrane and ear canal normal.     Left Ear: Tympanic membrane and ear canal normal.  Eyes:     Conjunctiva/sclera: Conjunctivae normal.     Pupils: Pupils are equal, round, and reactive to light.  Cardiovascular:      Rate and Rhythm: Normal rate and regular rhythm.     Heart sounds: Normal heart sounds.  Pulmonary:     Effort: Pulmonary effort is normal.     Breath sounds: Normal breath sounds.  Abdominal:     General: Bowel sounds are normal. There is no distension.     Palpations: Abdomen is soft.     Tenderness: There is no abdominal tenderness. There is no guarding or rebound.  Musculoskeletal:     Right lower leg: No edema.     Left lower leg: No edema.  Skin:    General: Skin is warm and dry.  Neurological:     Mental Status: He is alert.  Psychiatric:        Mood and Affect: Mood normal.      Assessment/Plan:   Encounter for general adult medical examination with abnormal findings Physical exam completed.  Discussed adding in additional exercise.  Encouraged to increase vegetable intake and decrease soda intake.  Discussed finding out if his mother had genetic screening when she was diagnosed with ovarian cancer or breast cancer.  Discussed BRCA gene.  If he is unable to find this out or if she did not have genetic screening I would recommend meeting with a genetic counselor given her history.  Patient wonders about varicella titers and MMR titers.  These have been ordered.  Labs reviewed from his insurance evaluation.  She did ask about ways to increase the chance of becoming pregnant.  I discussed timed coitus based on ovulation with monitoring of temperatures or ovulation kits.  He notes his wife is discussing with GYN as well.  Ear pain, bilateral Likely eustachian tube dysfunction.  Encouraged him to try Claritin, Zyrtec, or Flonase.  If not improving we could have him see ENT.  Hypogonadism in male Encouraged him to continue to follow with endocrinology.  Loose stools Started around the time he started on Clomid.  GI distress is listed in side effects on up-to-date.  This issue has been improving and I did advise that if it does not resolve he should discuss with his endocrinologist  to see if the Clomid could be contributing.  Elevated ALT measurement Noted on life insurance lab work.  Recheck today.   Orders Placed This Encounter  Procedures  . Measles/Mumps/Rubella Immunity  . Varicella zoster antibody, IgG  . Hepatic function panel    No orders of the defined types were placed in this encounter.    Tommi Rumps, MD [place]

## 2018-09-26 NOTE — Patient Instructions (Addendum)
Nice to see you. Please try to incorporate more exercise as discussed.  Please try to work in more vegetables as well. Please try Claritin or Zyrtec or Flonase for your ear discomfort.  If this is not beneficial please let us know and we can have you see ENT. You can try using ovulation kits or monitoring temperatures for your wife to help with timing of sexual intercourse to increase likelihood of becoming pregnant.  It can take up to a year for couples to become pregnant. We will get lab work today and contact you with results. The Clomid does have GI side effects listed.  It may be worthwhile discussing this with your endocrinologist if the symptoms continue.

## 2018-09-26 NOTE — Assessment & Plan Note (Signed)
Encouraged him to continue to follow with endocrinology.

## 2018-09-26 NOTE — Assessment & Plan Note (Signed)
Likely eustachian tube dysfunction.  Encouraged him to try Claritin, Zyrtec, or Flonase.  If not improving we could have him see ENT.

## 2018-09-26 NOTE — Assessment & Plan Note (Addendum)
Physical exam completed.  Discussed adding in additional exercise.  Encouraged to increase vegetable intake and decrease soda intake.  Discussed finding out if his mother had genetic screening when she was diagnosed with ovarian cancer or breast cancer.  Discussed BRCA gene.  If he is unable to find this out or if she did not have genetic screening I would recommend meeting with a genetic counselor given her history.  Patient wonders about varicella titers and MMR titers.  These have been ordered.  Labs reviewed from his insurance evaluation.  She did ask about ways to increase the chance of becoming pregnant.  I discussed timed coitus based on ovulation with monitoring of temperatures or ovulation kits.  He notes his wife is discussing with GYN as well.

## 2018-09-26 NOTE — Assessment & Plan Note (Signed)
Started around the time he started on Clomid.  GI distress is listed in side effects on up-to-date.  This issue has been improving and I did advise that if it does not resolve he should discuss with his endocrinologist to see if the Clomid could be contributing.

## 2018-09-26 NOTE — Assessment & Plan Note (Signed)
Noted on life insurance lab work.  Recheck today.

## 2018-09-26 NOTE — Addendum Note (Signed)
Addended by: Elpidio Galea T on: 09/26/2018 10:12 AM   Modules accepted: Orders

## 2018-09-28 ENCOUNTER — Encounter: Payer: Self-pay | Admitting: Family Medicine

## 2018-09-28 DIAGNOSIS — Z7183 Encounter for nonprocreative genetic counseling: Secondary | ICD-10-CM

## 2018-09-28 DIAGNOSIS — Z1371 Encounter for nonprocreative screening for genetic disease carrier status: Secondary | ICD-10-CM

## 2018-10-04 ENCOUNTER — Other Ambulatory Visit: Payer: Self-pay

## 2018-10-04 ENCOUNTER — Other Ambulatory Visit (INDEPENDENT_AMBULATORY_CARE_PROVIDER_SITE_OTHER): Payer: BC Managed Care – PPO

## 2018-10-04 DIAGNOSIS — R7989 Other specified abnormal findings of blood chemistry: Secondary | ICD-10-CM

## 2018-10-04 DIAGNOSIS — R7401 Elevation of levels of liver transaminase levels: Secondary | ICD-10-CM

## 2018-10-04 DIAGNOSIS — E291 Testicular hypofunction: Secondary | ICD-10-CM

## 2018-10-04 DIAGNOSIS — Z0001 Encounter for general adult medical examination with abnormal findings: Secondary | ICD-10-CM

## 2018-10-04 DIAGNOSIS — R740 Nonspecific elevation of levels of transaminase and lactic acid dehydrogenase [LDH]: Secondary | ICD-10-CM | POA: Diagnosis not present

## 2018-10-04 DIAGNOSIS — Z789 Other specified health status: Secondary | ICD-10-CM | POA: Diagnosis not present

## 2018-10-04 DIAGNOSIS — R945 Abnormal results of liver function studies: Secondary | ICD-10-CM | POA: Diagnosis not present

## 2018-10-05 LAB — VARICELLA ZOSTER ANTIBODY, IGG: Varicella IgG: >4000 index

## 2018-10-05 LAB — MEASLES/MUMPS/RUBELLA IMMUNITY
Mumps IgG: 91 AU/mL
Rubella: 9.97 index
Rubeola IgG: 32.1 AU/mL

## 2018-10-05 LAB — HEPATIC FUNCTION PANEL
AG Ratio: 2 (calc) (ref 1.0–2.5)
ALT: 67 U/L — ABNORMAL HIGH (ref 9–46)
AST: 41 U/L — ABNORMAL HIGH (ref 10–40)
Albumin: 4.6 g/dL (ref 3.6–5.1)
Alkaline phosphatase (APISO): 54 U/L (ref 36–130)
Bilirubin, Direct: 0.2 mg/dL (ref 0.0–0.2)
Globulin: 2.3 g/dL (calc) (ref 1.9–3.7)
Indirect Bilirubin: 1.3 mg/dL (calc) — ABNORMAL HIGH (ref 0.2–1.2)
Total Bilirubin: 1.5 mg/dL — ABNORMAL HIGH (ref 0.2–1.2)
Total Protein: 6.9 g/dL (ref 6.1–8.1)

## 2018-10-06 ENCOUNTER — Encounter: Payer: 59 | Admitting: Family Medicine

## 2018-10-06 ENCOUNTER — Encounter: Payer: Self-pay | Admitting: Family Medicine

## 2018-10-06 ENCOUNTER — Other Ambulatory Visit: Payer: Self-pay | Admitting: Family Medicine

## 2018-10-06 DIAGNOSIS — R7989 Other specified abnormal findings of blood chemistry: Secondary | ICD-10-CM

## 2018-10-06 NOTE — Progress Notes (Signed)
Liver US ordered

## 2018-10-20 ENCOUNTER — Ambulatory Visit: Payer: BC Managed Care – PPO

## 2018-10-26 ENCOUNTER — Telehealth: Payer: Self-pay

## 2018-10-26 DIAGNOSIS — R945 Abnormal results of liver function studies: Secondary | ICD-10-CM

## 2018-10-26 DIAGNOSIS — R7989 Other specified abnormal findings of blood chemistry: Secondary | ICD-10-CM

## 2018-10-26 NOTE — Telephone Encounter (Signed)
Orders co-signed  LG

## 2018-10-26 NOTE — Telephone Encounter (Signed)
Copied from [place] [id number]. Topic: General - Call Back - No Documentation >> Oct 26, 2018  2:31 PM Erick Blinks wrote: Reason for CRM: Message for PCP Megan from [facility] the hospital called to request that order for abdomen ultrasound include upper right quadrant in order. Please advise Best contact: [phone]

## 2018-10-26 NOTE — Telephone Encounter (Signed)
The order that is in place is for a RUQ [place] though I will reorder.

## 2018-10-27 ENCOUNTER — Ambulatory Visit: Admission: RE | Admit: 2018-10-27 | Payer: BC Managed Care – PPO | Source: Ambulatory Visit

## 2018-10-30 ENCOUNTER — Ambulatory Visit
Admission: RE | Admit: 2018-10-30 | Discharge: 2018-10-30 | Disposition: A | Discharge status: Home / Self Care | Payer: BC Managed Care – PPO | Source: Ambulatory Visit | Attending: Family Medicine | Admitting: Family Medicine

## 2018-10-30 ENCOUNTER — Other Ambulatory Visit: Payer: Self-pay

## 2018-10-30 DIAGNOSIS — K760 Fatty (change of) liver, not elsewhere classified: Secondary | ICD-10-CM | POA: Diagnosis not present

## 2018-10-30 DIAGNOSIS — R7989 Other specified abnormal findings of blood chemistry: Secondary | ICD-10-CM

## 2018-10-30 DIAGNOSIS — R945 Abnormal results of liver function studies: Secondary | ICD-10-CM | POA: Diagnosis not present

## 2018-11-15 DIAGNOSIS — K760 Fatty (change of) liver, not elsewhere classified: Secondary | ICD-10-CM

## 2018-11-22 ENCOUNTER — Encounter: Payer: Self-pay | Admitting: *Deleted

## 2018-12-21 DIAGNOSIS — R7989 Other specified abnormal findings of blood chemistry: Secondary | ICD-10-CM | POA: Diagnosis not present

## 2018-12-21 DIAGNOSIS — E221 Hyperprolactinemia: Secondary | ICD-10-CM | POA: Diagnosis not present

## 2018-12-26 ENCOUNTER — Other Ambulatory Visit: Payer: Self-pay

## 2018-12-27 ENCOUNTER — Other Ambulatory Visit: Payer: Self-pay

## 2018-12-27 ENCOUNTER — Ambulatory Visit (INDEPENDENT_AMBULATORY_CARE_PROVIDER_SITE_OTHER): Payer: BC Managed Care – PPO | Admitting: Gastroenterology

## 2018-12-27 ENCOUNTER — Encounter: Payer: Self-pay | Admitting: Gastroenterology

## 2018-12-27 VITALS — BP 138/77 | HR 79 | Temp 97.1°F | Ht 70.0 in | Wt 257.4 lb

## 2018-12-27 DIAGNOSIS — K760 Fatty (change of) liver, not elsewhere classified: Secondary | ICD-10-CM

## 2018-12-27 NOTE — Patient Instructions (Signed)
Mediterranean Diet A Mediterranean diet refers to food and lifestyle choices that are based on the traditions of countries located on the [place] Sea. This way of eating has been shown to help prevent certain conditions and improve outcomes for people who have chronic diseases, like kidney disease and heart disease. What are tips for following this plan? Lifestyle  Cook and eat meals together with your family, when possible.  Drink enough fluid to keep your urine clear or pale yellow.  Be physically active every day. This includes: ? Aerobic exercise like running or swimming. ? Leisure activities like gardening, walking, or housework.  Get 7-8 hours of sleep each night.  If recommended by your health care provider, drink red wine in moderation. This means 1 glass a day for nonpregnant women and 2 glasses a day for men. A glass of wine equals 5 oz (150 mL). Reading food labels   Check the serving size of packaged foods. For foods such as rice and pasta, the serving size refers to the amount of cooked product, not dry.  Check the total fat in packaged foods. Avoid foods that have saturated fat or trans fats.  Check the ingredients list for added sugars, such as corn syrup. Shopping  At the grocery store, buy most of your food from the areas near the walls of the store. This includes: ? Fresh fruits and vegetables (produce). ? Grains, beans, nuts, and seeds. Some of these may be available in unpackaged forms or large amounts (in bulk). ? Fresh seafood. ? Poultry and eggs. ? Low-fat dairy products.  Buy whole ingredients instead of prepackaged foods.  Buy fresh fruits and vegetables in-season from local farmers markets.  Buy frozen fruits and vegetables in resealable bags.  If you do not have access to quality fresh seafood, buy precooked frozen shrimp or canned fish, such as tuna, salmon, or sardines.  Buy small amounts of raw or cooked vegetables, salads, or olives from  the deli or salad bar at your store.  Stock your pantry so you always have certain foods on hand, such as olive oil, canned tuna, canned tomatoes, rice, pasta, and beans. Cooking  Cook foods with extra-virgin olive oil instead of using butter or other vegetable oils.  Have meat as a side dish, and have vegetables or grains as your main dish. This means having meat in small portions or adding small amounts of meat to foods like pasta or stew.  Use beans or vegetables instead of meat in common dishes like chili or lasagna.  Experiment with different cooking methods. Try roasting or broiling vegetables instead of steaming or sauteing them.  Add frozen vegetables to soups, stews, pasta, or rice.  Add nuts or seeds for added healthy fat at each meal. You can add these to yogurt, salads, or vegetable dishes.  Marinate fish or vegetables using olive oil, lemon juice, garlic, and fresh herbs. Meal planning   Plan to eat 1 vegetarian meal one day each week. Try to work up to 2 vegetarian meals, if possible.  Eat seafood 2 or more times a week.  Have healthy snacks readily available, such as: ? Vegetable sticks with hummus. ? Greek yogurt. ? Fruit and nut trail mix.  Eat balanced meals throughout the week. This includes: ? Fruit: 2-3 servings a day ? Vegetables: 4-5 servings a day ? Low-fat dairy: 2 servings a day ? Fish, poultry, or lean meat: 1 serving a day ? Beans and legumes: 2 or more servings a week ?   Nuts and seeds: 1-2 servings a day ? Whole grains: 6-8 servings a day ? Extra-virgin olive oil: 3-4 servings a day  Limit red meat and sweets to only a few servings a month What are my food choices?  Mediterranean diet ? Recommended  Grains: Whole-grain pasta. Brown rice. Bulgar wheat. Polenta. Couscous. Whole-wheat bread. Oatmeal. Quinoa.  Vegetables: Artichokes. Beets. Broccoli. Cabbage. Carrots. Eggplant. Green beans. Chard. Kale. Spinach. Onions. Leeks. Peas. Squash.  Tomatoes. Peppers. Radishes.  Fruits: Apples. Apricots. Avocado. Berries. Bananas. Cherries. Dates. Figs. Grapes. Lemons. Melon. Oranges. Peaches. Plums. Pomegranate.  Meats and other protein foods: Beans. Almonds. Sunflower seeds. Pine nuts. Peanuts. Cod. Salmon. Scallops. Shrimp. Tuna. Tilapia. Clams. Oysters. Eggs.  Dairy: Low-fat milk. Cheese. Greek yogurt.  Beverages: Water. Red wine. Herbal tea.  Fats and oils: Extra virgin olive oil. Avocado oil. Grape seed oil.  Sweets and desserts: Greek yogurt with honey. Baked apples. Poached pears. Trail mix.  Seasoning and other foods: Basil. Cilantro. Coriander. Cumin. Mint. Parsley. Sage. Rosemary. Tarragon. Garlic. Oregano. Thyme. Pepper. Balsalmic vinegar. Tahini. Hummus. Tomato sauce. Olives. Mushrooms. ? Limit these  Grains: Prepackaged pasta or rice dishes. Prepackaged cereal with added sugar.  Vegetables: Deep fried potatoes (french fries).  Fruits: Fruit canned in syrup.  Meats and other protein foods: Beef. Pork. Lamb. Poultry with skin. Hot dogs. Bacon.  Dairy: Ice cream. Sour cream. Whole milk.  Beverages: Juice. Sugar-sweetened soft drinks. Beer. Liquor and spirits.  Fats and oils: Butter. Canola oil. Vegetable oil. Beef fat (tallow). Lard.  Sweets and desserts: Cookies. Cakes. Pies. Candy.  Seasoning and other foods: Mayonnaise. Premade sauces and marinades. The items listed may not be a complete list. Talk with your dietitian about what dietary choices are right for you. Summary  The Mediterranean diet includes both food and lifestyle choices.  Eat a variety of fresh fruits and vegetables, beans, nuts, seeds, and whole grains.  Limit the amount of red meat and sweets that you eat.  Talk with your health care provider about whether it is safe for you to drink red wine in moderation. This means 1 glass a day for nonpregnant women and 2 glasses a day for men. A glass of wine equals 5 oz (150 mL). This information  is not intended to replace advice given to you by your health care provider. Make sure you discuss any questions you have with your health care provider. Document Released: 09/18/2015 Document Revised: 09/25/2015 Document Reviewed: 09/18/2015 Elsevier Patient Education  2020 [employer]  

## 2018-12-27 NOTE — Progress Notes (Signed)
Gastroenterology Consultation  Referring Provider:     Leone Haven, MD Primary Care Physician:  Leone Haven, MD Primary Gastroenterologist:  Dr. Allen Norris     Reason for Consultation:     Fatty liver        HPI:   Billy Willis is a 38 y.o. y/o male referred for consultation & management of fatty liver by Dr. Caryl Bis, Angela Adam, MD.  This patient comes in today with a history of fatty liver found on ultrasound after being found to have abnormal liver enzymes.  The patient's liver enzymes showed an elevated AST and ALT with a total bilirubin being elevated but fractionation shows it to be the indirect bilirubin.  The labs showed:  Component     Latest Ref Rng & Units 06/01/2017 10/04/2018  Total Bilirubin     0.2 - 1.2 mg/dL 1.2 1.5 (H)  Bilirubin, Direct     0.0 - 0.2 mg/dL  0.2  Indirect Bilirubin     0.2 - 1.2 mg/dL (calc)  1.3 (H)  Alkaline phosphatase (APISO)     36 - 130 U/L  54  AST     10 - 40 U/L 27 41 (H)  ALT     9 - 46 U/L 45 67 (H)   As can be seen from the labs the patient's liver enzymes were normal back in April 2019 but were elevated in August of this year.  The patient had a lipid panel showing increased cholesterol and triglycerides. The patient denies any symptoms he also denies any fevers chills nausea vomiting black stools or bloody stools.  There is no report of any family history of colon cancer or colon polyps.  The patient also denies any alcohol abuse and states he drinks probably 2 beers a week on average.  Past Medical History:  Diagnosis Date  . History of chicken pox   . Kidney stones 2015    Past Surgical History:  Procedure Laterality Date  . WISDOM TOOTH EXTRACTION      Prior to Admission medications   Medication Sig Start Date End Date Taking? Authorizing Provider  cabergoline (DOSTINEX) 0.5 MG tablet Take by mouth. 07/08/18   [provider]  clomiPHENE (CLOMID) 50 MG tablet Take 50 mg by mouth daily. Taken 1/2 tablet  once per day    [provider]    Family History  Problem Relation Age of Onset  . Cancer Mother        ovary/uterine cancer  . Mental illness Father   . Early death Father   . Arthritis Maternal Grandmother   . Arthritis Maternal Grandfather   . Stroke Paternal Grandmother   . Stroke Paternal Grandfather      Social History   Tobacco Use  . Smoking status: Never Smoker  . Smokeless tobacco: Never Used  Substance Use Topics  . Alcohol use: Yes    Alcohol/week: 2.0 standard drinks    Types: 1 Cans of beer, 1 Shots of liquor per week  . Drug use: No    Allergies as of 12/27/2018  . (No Known Allergies)    Review of Systems:    All systems reviewed and negative except where noted in HPI.   Physical Exam:  There were no vitals taken for this visit. No LMP for male patient. General:   Alert,  Well-developed, well-nourished, pleasant and cooperative in NAD Head:  Normocephalic and atraumatic. Eyes:  Sclera clear, no icterus.   Conjunctiva pink. Ears:  Normal auditory acuity. Neck:  Supple; no masses or thyromegaly. Lungs:  Respirations even and unlabored.  Clear throughout to auscultation.   No wheezes, crackles, or rhonchi. No acute distress. Heart:  Regular rate and rhythm; no murmurs, clicks, rubs, or gallops. Abdomen:  Normal bowel sounds.  No bruits.  Soft, non-tender and non-distended without masses, hepatosplenomegaly or hernias noted.  No guarding or rebound tenderness.  Negative Carnett sign.   Rectal:  Deferred.  Msk:  Symmetrical without gross deformities.  Good, equal movement & strength bilaterally. Pulses:  Normal pulses noted. Extremities:  No clubbing or edema.  No cyanosis. Neurologic:  Alert and oriented x3;  grossly normal neurologically. Skin:  Intact without significant lesions or rashes.  No jaundice. Lymph Nodes:  No significant cervical adenopathy. Psych:  Alert and cooperative. Normal mood and affect.  Imaging Studies: No results  found.  Assessment and Plan:   Billy Willis is a 38 y.o. y/o male who comes in today with a history of abnormal liver enzymes intermittently over the last few years.  The patient was found to have fatty liver on ultrasound and has had abnormal liver enzymes with his bilirubin being mostly unconjugated with a normal conjugated/direct bilirubin.  This is consistent with the patient having Gilbert's syndrome.  The abnormal AST and ALT are likely due his fatty liver.  The patient has been told to start on a Mediterranean diet and has been told to try and lose weight with a follow-up in 3 to 4 months.  The patient will also have lab sent off for other possible cause of abnormal liver enzymes.  The patient will also be checked for his immunity to hepatitis A and hepatitis B and he will be vaccinated accordingly.  Sullivan Lone:This is a condition that causes a substance called "bilirubin" to build up in the blood.  Gilbert's syndrome is caused by an abnormal gene that runs in families. People who have Gilbert's syndrome were born with it.  Gilbert's syndrome does not need treatment.  NAFLD: Life-style modification to include routine physical activity of at least 20-30 minutes daily as well as dietary modification was discussed. Specifically a Mediterranean style diet which is composed of fish, white meat, fresh fruit, vegetable, whole grains, and legumes was recommended. Foods enriched with excess refined sugars, such as high fructose corn syrup, sweetened beveraages and hydrogenated fats ( i,e. the "Western Diet") should be avoided. A higher protein, lower carbohydrate, lower fat diet is ideal in decreasing excess hydrogenated fats and complex sugars. Coffee consumption has been proven to be beneficial in improving liver injury. Routine physical activity which results in 3-5% weight loss improves insulin resistance and hepatic steatosis. Weight loss of 5-10% may improve nonalcoholic steatohepatisis or even hepatic  fibrosis. Both diet and exercise are the foundation of treatment for patients with NAFLD.     Midge Minium, MD. Clementeen Graham    Note: This dictation was prepared with Dragon dictation along with smaller phrase technology. Any transcriptional errors that result from this process are unintentional.

## 2018-12-28 DIAGNOSIS — E221 Hyperprolactinemia: Secondary | ICD-10-CM | POA: Diagnosis not present

## 2018-12-28 DIAGNOSIS — E230 Hypopituitarism: Secondary | ICD-10-CM | POA: Diagnosis not present

## 2019-01-02 ENCOUNTER — Telehealth: Payer: Self-pay

## 2019-01-02 NOTE — Telephone Encounter (Signed)
Pt notified of lab results. Pt added to recall list for a follow up appt.

## 2019-01-02 NOTE — Telephone Encounter (Signed)
-----   Message from Lucilla Lame, MD sent at 01/01/2019  8:10 AM EST ----- Let the patient know that he is immune to hepatitis A and hepatitis B and does not need to be vaccinated for these.  His hepatitis C was also negative.  There was nothing in his labs that would explain his elevated liver enzymes although his ANA was positive this can be due to many different factors.  We know that he has fatty liver and should lose weight and repeat liver enzymes in 3 to 6 months to see if they are going down.  If not then the patient may need to be set up for a liver biopsy.  Please make sure he has a follow-up in approximately 4 months.

## 2019-01-03 LAB — IRON AND TIBC
Iron Saturation: 21 % (ref 15–55)
Iron: 68 ug/dL (ref 38–169)
Total Iron Binding Capacity: 326 ug/dL (ref 250–450)
UIBC: 258 ug/dL (ref 111–343)

## 2019-01-03 LAB — FERRITIN: Ferritin: 287 ng/mL (ref 30–400)

## 2019-01-03 LAB — CERULOPLASMIN: Ceruloplasmin: 22.8 mg/dL (ref 16.0–31.0)

## 2019-01-03 LAB — HEPATITIS B SURFACE ANTIGEN: Hepatitis B Surface Ag: NEGATIVE

## 2019-01-03 LAB — ANA: Anti Nuclear Antibody (ANA): POSITIVE — AB

## 2019-01-03 LAB — HEPATIC FUNCTION PANEL
ALT: 84 IU/L — ABNORMAL HIGH (ref 0–44)
AST: 48 IU/L — ABNORMAL HIGH (ref 0–40)
Albumin: 4.7 g/dL (ref 4.0–5.0)
Alkaline Phosphatase: 69 IU/L (ref 39–117)
Bilirubin Total: 1.1 mg/dL (ref 0.0–1.2)
Bilirubin, Direct: 0.22 mg/dL (ref 0.00–0.40)
Total Protein: 6.9 g/dL (ref 6.0–8.5)

## 2019-01-03 LAB — MITOCHONDRIAL ANTIBODIES: Mitochondrial Ab: <20 Units (ref 0.0–20.0)

## 2019-01-03 LAB — ALPHA-1-ANTITRYPSIN: A-1 Antitrypsin: 141 mg/dL (ref 95–164)

## 2019-01-03 LAB — HEPATITIS B SURFACE ANTIBODY,QUALITATIVE: Hep B Surface Ab, Qual: REACTIVE

## 2019-01-03 LAB — ANTI-SMOOTH MUSCLE ANTIBODY, IGG: Smooth Muscle Ab: 10 Units (ref 0–19)

## 2019-01-03 LAB — HEPATITIS A ANTIBODY, TOTAL: hep A Total Ab: POSITIVE — AB

## 2019-01-03 LAB — HEPATITIS C ANTIBODY: Hep C Virus Ab: <0.1 s/co ratio (ref 0.0–0.9)

## 2019-02-13 DIAGNOSIS — L700 Acne vulgaris: Secondary | ICD-10-CM | POA: Diagnosis not present

## 2019-02-13 DIAGNOSIS — D2262 Melanocytic nevi of left upper limb, including shoulder: Secondary | ICD-10-CM | POA: Diagnosis not present

## 2019-02-13 DIAGNOSIS — D225 Melanocytic nevi of trunk: Secondary | ICD-10-CM | POA: Diagnosis not present

## 2019-02-13 DIAGNOSIS — D2261 Melanocytic nevi of right upper limb, including shoulder: Secondary | ICD-10-CM | POA: Diagnosis not present

## 2019-03-06 DIAGNOSIS — H9313 Tinnitus, bilateral: Secondary | ICD-10-CM | POA: Diagnosis not present

## 2019-03-06 DIAGNOSIS — H6983 Other specified disorders of Eustachian tube, bilateral: Secondary | ICD-10-CM | POA: Diagnosis not present

## 2019-04-04 ENCOUNTER — Ambulatory Visit: Payer: BC Managed Care – PPO | Admitting: Gastroenterology

## 2019-04-10 DIAGNOSIS — E230 Hypopituitarism: Secondary | ICD-10-CM | POA: Diagnosis not present

## 2019-04-10 DIAGNOSIS — R7989 Other specified abnormal findings of blood chemistry: Secondary | ICD-10-CM | POA: Diagnosis not present

## 2019-04-24 ENCOUNTER — Encounter: Payer: Self-pay | Admitting: Gastroenterology

## 2019-04-24 ENCOUNTER — Other Ambulatory Visit: Payer: Self-pay

## 2019-04-24 ENCOUNTER — Ambulatory Visit (INDEPENDENT_AMBULATORY_CARE_PROVIDER_SITE_OTHER): Payer: BC Managed Care – PPO | Admitting: Gastroenterology

## 2019-04-24 VITALS — BP 126/77 | HR 92 | Temp 98.2°F | Ht 70.0 in | Wt 268.8 lb

## 2019-04-24 DIAGNOSIS — R748 Abnormal levels of other serum enzymes: Secondary | ICD-10-CM

## 2019-04-24 NOTE — Progress Notes (Signed)
Primary Care Physician: Glori Luis, MD  Primary Gastroenterologist:  Dr. Midge Minium  Chief Complaint  Patient presents with  . Follow up fatty liver    HPI: Billy Willis is a 39 y.o. male here for follow-up of abnormal liver enzymes.  The patient was found to have a positive ANA and fatty liver on ultrasound.  The patient was asked to lose some weight and follow-up to see if the liver enzymes were improving.  The patient reports now that he has gained approximately 10 pounds since last seeing me.  There is no report of any abdominal pain nausea vomiting fevers or chills.  The patient also denies any jaundice or change in bowel habits.  Past Medical History:  Diagnosis Date  . History of chicken pox   . Kidney stones 2015    Current Outpatient Medications  Medication Sig Dispense Refill  . clomiPHENE (CLOMID) 50 MG tablet Take 50 mg by mouth daily. Taken 1/2 tablet once per day    . fluticasone (FLONASE) 50 MCG/ACT nasal spray Place 1 spray into both nostrils 2 (two) times daily.     No current facility-administered medications for this visit.    Allergies as of 04/24/2019  . (No Known Allergies)    ROS:  General: Negative for anorexia, weight loss, fever, chills, fatigue, weakness. ENT: Negative for hoarseness, difficulty swallowing , nasal congestion. CV: Negative for chest pain, angina, palpitations, dyspnea on exertion, peripheral edema.  Respiratory: Negative for dyspnea at rest, dyspnea on exertion, cough, sputum, wheezing.  GI: See history of present illness. GU:  Negative for dysuria, hematuria, urinary incontinence, urinary frequency, nocturnal urination.  Endo: Negative for unusual weight change.    Physical Examination:   BP 126/77   Pulse 92   Temp 98.2 F (36.8 C) (Oral)   Ht 5\' 10"  (1.778 m)   Wt 268 lb 12.8 oz (121.9 kg)   BMI 38.57 kg/m   General: Well-nourished, well-developed in no acute distress.  Eyes: No icterus. Conjunctivae  pink. Lungs: Clear to auscultation bilaterally. Non-labored. Heart: Regular rate and rhythm, no murmurs rubs or gallops.  Abdomen: Bowel sounds are normal, nontender, nondistended, no hepatosplenomegaly or masses, no abdominal bruits or hernia , no rebound or guarding.   Extremities: No lower extremity edema. No clubbing or deformities. Neuro: Alert and oriented x 3.  Grossly intact. Skin: Warm and dry, no jaundice.   Psych: Alert and cooperative, normal mood and affect.  Labs:    Imaging Studies: No results found.  Assessment and Plan:   Billy Willis is a 39 y.o. y/o male who comes in with a history of fatty liver and a positive ANA.  The patient has not had any weight loss since last seeing me and in fact has gained weight.  I have requested the patient get liver enzymes today to see which way he is trending but he points out that since he is not losing weight it would not change any of our plans about the necessity for him to lose weight for the liver enzymes to go down.  I do not think that the patient's ANA being positive represents autoimmune hepatitis but have told him that he should follow-up in 3 months with weight loss and if his liver enzymes continue to be elevated despite weight loss then he may need a liver biopsy to look for other possible causes of abnormal liver enzymes.  The patient has been explained the plan and agrees with it.  Lucilla Lame, MD. Marval Regal    Note: This dictation was prepared with Dragon dictation along with smaller phrase technology. Any transcriptional errors that result from this process are unintentional.

## 2019-07-17 DIAGNOSIS — E230 Hypopituitarism: Secondary | ICD-10-CM | POA: Diagnosis not present

## 2019-07-17 DIAGNOSIS — E221 Hyperprolactinemia: Secondary | ICD-10-CM | POA: Diagnosis not present

## 2019-07-24 DIAGNOSIS — E230 Hypopituitarism: Secondary | ICD-10-CM | POA: Diagnosis not present

## 2019-07-24 DIAGNOSIS — E221 Hyperprolactinemia: Secondary | ICD-10-CM | POA: Diagnosis not present

## 2019-10-01 ENCOUNTER — Encounter: Payer: BC Managed Care – PPO | Admitting: Family Medicine

## 2019-11-06 ENCOUNTER — Encounter: Payer: BC Managed Care – PPO | Admitting: Family Medicine

## 2019-11-27 ENCOUNTER — Encounter: Payer: Self-pay | Admitting: Family Medicine

## 2019-11-27 ENCOUNTER — Ambulatory Visit (INDEPENDENT_AMBULATORY_CARE_PROVIDER_SITE_OTHER): Payer: BC Managed Care – PPO | Admitting: Family Medicine

## 2019-11-27 ENCOUNTER — Other Ambulatory Visit: Payer: Self-pay

## 2019-11-27 VITALS — BP 118/70 | HR 93 | Temp 98.7°F | Ht 71.0 in | Wt 267.2 lb

## 2019-11-27 DIAGNOSIS — Z13220 Encounter for screening for lipoid disorders: Secondary | ICD-10-CM

## 2019-11-27 DIAGNOSIS — E669 Obesity, unspecified: Secondary | ICD-10-CM

## 2019-11-27 DIAGNOSIS — Z0001 Encounter for general adult medical examination with abnormal findings: Secondary | ICD-10-CM | POA: Diagnosis not present

## 2019-11-27 DIAGNOSIS — K625 Hemorrhage of anus and rectum: Secondary | ICD-10-CM | POA: Diagnosis not present

## 2019-11-27 DIAGNOSIS — N509 Disorder of male genital organs, unspecified: Secondary | ICD-10-CM

## 2019-11-27 NOTE — Progress Notes (Signed)
Tommi Rumps, MD Phone: [phone]  Billy Willis is a 39 y.o. male who presents today for CPE.  Diet: Steak once a week. Typically has chicken and green vegetable for dinner. Salads for lunch. Eats out 1-2 times a week. Does drink 1-2 sodas or sweet teas per day. Exercise: Walks occasionally for exercise. Family history-  Prostate cancer: No  Colon cancer: No  Father died of a MI at age 55, paternal grandmother also passed away of an MI though it seems to be later in life Vaccines-   Flu: Up-to-date  Tetanus: Up-to-date  COVID19: Up-to-date HIV screening: Up-to-date Hep C Screening: Up-to-date Tobacco use: No Alcohol use: 2 beverages per week Illicit Drug use: No Dentist: Yes Ophthalmology: Yes  Bright red blood per rectum: This has been occurring once every couple of months. Occasionally occurs with straining though sometimes just has blood in his stool. No pain with bowel movements. No prior colonoscopy. Occasionally does have loose stools once every other month as well though that does not seem to be related to the blood in his stool. No abdominal pain. Occasionally feels bloated.  Scrotal pearl: Patient notes there is possibly some thickening of this on exam recently though he has not checked it consistently. Prior ultrasound with scrotal pearl. No intratesticular abnormalities were seen.  Active Ambulatory Problems    Diagnosis Date Noted  . Encounter for general adult medical examination with abnormal findings 05/07/2015  . Decreased energy 05/24/2017  . Scrotal lesion 05/24/2017  . Ear pain, bilateral 05/24/2017  . Jock itch 05/24/2017  . Hypogonadism in male 09/26/2018  . Loose stools 09/26/2018  . Elevated ALT measurement 09/26/2018  . Bright red blood per rectum 11/27/2019   Resolved Ambulatory Problems    Diagnosis Date Noted  . Vasovagal syncope 05/07/2015  . Lump in the testicle 05/07/2015  . Left flank pain 07/16/2015   Past Medical History:    Diagnosis Date  . History of chicken pox   . Kidney stones 2015    Family History  Problem Relation Age of Onset  . Cancer Mother        ovary/uterine cancer  . Mental illness Father   . Early death Father   . Arthritis Maternal Grandmother   . Arthritis Maternal Grandfather   . Stroke Paternal Grandmother   . Stroke Paternal Grandfather     Social History   Socioeconomic History  . Marital status: Married    Spouse name: Not on file  . Number of children: Not on file  . Years of education: Not on file  . Highest education level: Not on file  Occupational History  . Not on file  Tobacco Use  . Smoking status: Never Smoker  . Smokeless tobacco: Never Used  Vaping Use  . Vaping Use: Never used  Substance and Sexual Activity  . Alcohol use: Yes    Alcohol/week: 2.0 standard drinks    Types: 1 Cans of beer, 1 Shots of liquor per week  . Drug use: No  . Sexual activity: Not Currently    Birth control/protection: Abstinence    Comment: not for a long time, had unprotected sex 15 years ago  Other Topics Concern  . Not on file  Social History Narrative  . Not on file   Social Determinants of Health   Financial Resource Strain:   . Difficulty of Paying Living Expenses: Not on file  Food Insecurity:   . Worried About Charity fundraiser in the Last Year: Not  on file  . Ran Out of Food in the Last Year: Not on file  Transportation Needs:   . Lack of Transportation (Medical): Not on file  . Lack of Transportation (Non-Medical): Not on file  Physical Activity:   . Days of Exercise per Week: Not on file  . Minutes of Exercise per Session: Not on file  Stress:   . Feeling of Stress : Not on file  Social Connections:   . Frequency of Communication with Friends and Family: Not on file  . Frequency of Social Gatherings with Friends and Family: Not on file  . Attends Religious Services: Not on file  . Active Member of Clubs or Organizations: Not on file  . Attends English as a second language teacher Meetings: Not on file  . Marital Status: Not on file  Intimate Partner Violence:   . Fear of Current or Ex-Partner: Not on file  . Emotionally Abused: Not on file  . Physically Abused: Not on file  . Sexually Abused: Not on file    ROS  General:  Negative for nexplained weight loss, fever Skin: Negative for new or changing mole, sore that won't heal HEENT: Negative for trouble hearing, trouble seeing, ringing in ears, mouth sores, hoarseness, change in voice, dysphagia. CV:  Negative for chest pain, dyspnea, edema, palpitations Resp: Negative for cough, dyspnea, hemoptysis GI: Positive for occasional diarrhea, hematochezia, negative for nausea, vomiting, constipation, abdominal pain, melena. GU: Negative for dysuria, incontinence, urinary hesitance, hematuria, vaginal or penile discharge, polyuria, sexual difficulty, lumps in testicle or breasts MSK: Negative for muscle cramps or aches, joint pain or swelling Neuro: Negative for headaches, weakness, numbness, dizziness, passing out/fainting Psych: Negative for depression, anxiety, memory problems  Objective  Physical Exam Vitals:   11/27/19 1242  BP: 118/70  Pulse: 93  Temp: 98.7 F (37.1 C)  SpO2: 97%    BP Readings from Last 3 Encounters:  11/27/19 118/70  04/24/19 126/77  12/27/18 138/77   Wt Readings from Last 3 Encounters:  11/27/19 267 lb 3.2 oz (121.2 kg)  04/24/19 268 lb 12.8 oz (121.9 kg)  12/27/18 257 lb 6.4 oz (116.8 kg)    Physical Exam Constitutional:      General: He is not in acute distress.    Appearance: He is not diaphoretic.  HENT:     Head: Normocephalic and atraumatic.  Eyes:     Conjunctiva/sclera: Conjunctivae normal.     Pupils: Pupils are equal, round, and reactive to light.  Cardiovascular:     Rate and Rhythm: Normal rate and regular rhythm.     Heart sounds: Normal heart sounds.  Pulmonary:     Effort: Pulmonary effort is normal.     Breath sounds: Normal breath  sounds.  Abdominal:     General: Bowel sounds are normal. There is no distension.     Palpations: Abdomen is soft.     Tenderness: There is no abdominal tenderness. There is no guarding or rebound.  Genitourinary:    Comments: Normal uncircumcised penis, normal scrotum, normal testicles, normal epididymis, normal vas deferens, no masses palpated Musculoskeletal:     Right lower leg: No edema.     Left lower leg: No edema.  Lymphadenopathy:     Cervical: No cervical adenopathy.  Skin:    General: Skin is warm and dry.  Neurological:     Mental Status: He is alert.  Psychiatric:        Mood and Affect: Mood normal.      Assessment/Plan:  Problem List Items Addressed This Visit    Bright red blood per rectum    Refer to GI to consider colonoscopy.      Relevant Orders   Ambulatory referral to Gastroenterology   CBC   Encounter for general adult medical examination with abnormal findings - Primary    Physical exam completed. Encouraged healthy diet and adding in exercise. Discussed risk factor management given history of MI in his family. Vaccines up-to-date. Lab work as outlined below.      Scrotal lesion    Benign exam today. Prior ultrasound reassuring. He will monitor.       Other Visit Diagnoses    Obesity (BMI 35.0-39.9 without comorbidity)       Relevant Orders   HgB A1c   Lipid screening       Relevant Orders   Comp Met (CMET)   Lipid panel      This visit occurred during the SARS-CoV-2 public health emergency.  Safety protocols were in place, including screening questions prior to the visit, additional usage of staff PPE, and extensive cleaning of exam room while observing appropriate contact time as indicated for disinfecting solutions.    Tommi Rumps, MD [place]

## 2019-11-27 NOTE — Patient Instructions (Signed)
Nice to see you. We will get lab work in about a week. Please try to add in some exercise. Please try to eat a healthy diet and cut down on your soda intake. I have referred you to GI to consider colonoscopy.

## 2019-11-27 NOTE — Assessment & Plan Note (Signed)
Refer to GI to consider colonoscopy.

## 2019-11-27 NOTE — Assessment & Plan Note (Signed)
Benign exam today. Prior ultrasound reassuring. He will monitor.

## 2019-11-27 NOTE — Assessment & Plan Note (Signed)
Physical exam completed. Encouraged healthy diet and adding in exercise. Discussed risk factor management given history of MI in his family. Vaccines up-to-date. Lab work as outlined below.

## 2019-11-28 ENCOUNTER — Encounter: Payer: Self-pay | Admitting: Family Medicine

## 2019-12-04 ENCOUNTER — Other Ambulatory Visit: Payer: BC Managed Care – PPO

## 2019-12-11 ENCOUNTER — Other Ambulatory Visit (INDEPENDENT_AMBULATORY_CARE_PROVIDER_SITE_OTHER): Payer: BC Managed Care – PPO

## 2019-12-11 ENCOUNTER — Other Ambulatory Visit: Payer: Self-pay

## 2019-12-11 DIAGNOSIS — K625 Hemorrhage of anus and rectum: Secondary | ICD-10-CM

## 2019-12-11 DIAGNOSIS — E669 Obesity, unspecified: Secondary | ICD-10-CM | POA: Diagnosis not present

## 2019-12-11 DIAGNOSIS — Z13220 Encounter for screening for lipoid disorders: Secondary | ICD-10-CM | POA: Diagnosis not present

## 2019-12-11 LAB — LIPID PANEL
Cholesterol: 172 mg/dL (ref 0–200)
HDL: 43 mg/dL (ref >39.00)
LDL Cholesterol: 101 mg/dL — ABNORMAL HIGH (ref 0–99)
NonHDL: 129.28
Total CHOL/HDL Ratio: 4
Triglycerides: 143 mg/dL (ref 0.0–149.0)
VLDL: 28.6 mg/dL (ref 0.0–40.0)

## 2019-12-11 LAB — COMPREHENSIVE METABOLIC PANEL
ALT: 99 U/L — ABNORMAL HIGH (ref 0–53)
AST: 47 U/L — ABNORMAL HIGH (ref 0–37)
Albumin: 4.7 g/dL (ref 3.5–5.2)
Alkaline Phosphatase: 67 U/L (ref 39–117)
BUN: 9 mg/dL (ref 6–23)
CO2: 28 mEq/L (ref 19–32)
Calcium: 9.7 mg/dL (ref 8.4–10.5)
Chloride: 100 mEq/L (ref 96–112)
Creatinine, Ser: 0.95 mg/dL (ref 0.40–1.50)
GFR: 100.49 mL/min (ref >60.00)
Glucose, Bld: 82 mg/dL (ref 70–99)
Potassium: 4.2 mEq/L (ref 3.5–5.1)
Sodium: 136 mEq/L (ref 135–145)
Total Bilirubin: 1.5 mg/dL — ABNORMAL HIGH (ref 0.2–1.2)
Total Protein: 7.2 g/dL (ref 6.0–8.3)

## 2019-12-11 LAB — CBC
HCT: 45.1 % (ref 39.0–52.0)
Hemoglobin: 15.4 g/dL (ref 13.0–17.0)
MCHC: 34.2 g/dL (ref 30.0–36.0)
MCV: 87.4 fl (ref 78.0–100.0)
Platelets: 223 10*3/uL (ref 150.0–400.0)
RBC: 5.16 Mil/uL (ref 4.22–5.81)
RDW: 13.6 % (ref 11.5–15.5)
WBC: 7.9 10*3/uL (ref 4.0–10.5)

## 2019-12-11 LAB — HEMOGLOBIN A1C: Hgb A1c MFr Bld: 5.4 % (ref 4.6–6.5)

## 2020-01-14 ENCOUNTER — Other Ambulatory Visit: Payer: Self-pay

## 2020-01-14 DIAGNOSIS — K625 Hemorrhage of anus and rectum: Secondary | ICD-10-CM

## 2020-01-14 MED ORDER — SUPREP BOWEL PREP KIT 17.5-3.13-1.6 GM/177ML PO SOLN: 1.0000 | ORAL | 0 refills | Status: DC

## 2020-01-21 ENCOUNTER — Ambulatory Visit: Payer: BC Managed Care – PPO | Admitting: Gastroenterology

## 2020-02-01 ENCOUNTER — Other Ambulatory Visit
Admission: RE | Admit: 2020-02-01 | Discharge: 2020-02-01 | Disposition: A | Discharge status: Home / Self Care | Payer: BC Managed Care – PPO | Source: Ambulatory Visit | Attending: Gastroenterology | Admitting: Gastroenterology

## 2020-02-01 ENCOUNTER — Other Ambulatory Visit: Payer: Self-pay

## 2020-02-01 DIAGNOSIS — Z01812 Encounter for preprocedural laboratory examination: Secondary | ICD-10-CM | POA: Diagnosis not present

## 2020-02-01 DIAGNOSIS — U071 COVID-19: Secondary | ICD-10-CM | POA: Insufficient documentation

## 2020-02-02 ENCOUNTER — Telehealth: Payer: Self-pay | Admitting: Family Medicine

## 2020-02-02 LAB — SARS CORONAVIRUS 2 (TAT 6-24 HRS): SARS Coronavirus 2: POSITIVE — AB

## 2020-02-02 NOTE — Telephone Encounter (Signed)
I attempted to contact the patient about his positive COVID test result. There was no answer and I left a message asking him to call back to the office and to ask to speak with the on-call provider about a test result. I advised that I would also send a mychart message as well outlining his quarantine guidelines. Of note it appears that the patient has reviewed his COVID test results on mychart. I will forward this to Dr Servando Snare as well as the test was completed in preparation for his upcoming GI procedure.

## 2020-02-04 NOTE — Telephone Encounter (Signed)
Positive covid test results submitted by fax to [facility].  Chyla Schlender,cma

## 2020-02-04 NOTE — Telephone Encounter (Signed)
Can you reschedule him since he is COVID positive.

## 2020-03-04 ENCOUNTER — Encounter
Admission: RE | Disposition: A | Discharge status: Home / Self Care | Payer: Self-pay | Source: Home / Self Care | Attending: Gastroenterology

## 2020-03-04 ENCOUNTER — Ambulatory Visit: Payer: BC Managed Care – PPO | Admitting: Registered Nurse

## 2020-03-04 ENCOUNTER — Other Ambulatory Visit: Payer: Self-pay

## 2020-03-04 ENCOUNTER — Ambulatory Visit
Admission: RE | Admit: 2020-03-04 | Discharge: 2020-03-04 | Disposition: A | Discharge status: Home / Self Care | Payer: BC Managed Care – PPO | Attending: Gastroenterology | Admitting: Gastroenterology

## 2020-03-04 DIAGNOSIS — K625 Hemorrhage of anus and rectum: Secondary | ICD-10-CM

## 2020-03-04 DIAGNOSIS — Z79899 Other long term (current) drug therapy: Secondary | ICD-10-CM | POA: Diagnosis not present

## 2020-03-04 DIAGNOSIS — K640 First degree hemorrhoids: Secondary | ICD-10-CM | POA: Insufficient documentation

## 2020-03-04 DIAGNOSIS — K921 Melena: Secondary | ICD-10-CM | POA: Insufficient documentation

## 2020-03-04 DIAGNOSIS — K5730 Diverticulosis of large intestine without perforation or abscess without bleeding: Secondary | ICD-10-CM | POA: Insufficient documentation

## 2020-03-04 HISTORY — PX: COLONOSCOPY WITH PROPOFOL: SHX5780

## 2020-03-04 SURGERY — COLONOSCOPY WITH PROPOFOL
Anesthesia: General

## 2020-03-04 MED ORDER — SODIUM CHLORIDE 0.9 % IV SOLN
INTRAVENOUS | Status: DC
  Administered 2020-03-04: 20 mL/h via INTRAVENOUS

## 2020-03-04 MED ORDER — PROPOFOL 10 MG/ML IV BOLUS
INTRAVENOUS | Status: DC | PRN
  Administered 2020-03-04: 100 mg via INTRAVENOUS

## 2020-03-04 MED ORDER — PROPOFOL 500 MG/50ML IV EMUL
INTRAVENOUS | Status: DC | PRN
  Administered 2020-03-04: 170 ug/kg/min via INTRAVENOUS

## 2020-03-04 MED ORDER — LIDOCAINE HCL (CARDIAC) PF 100 MG/5ML IV SOSY
PREFILLED_SYRINGE | INTRAVENOUS | Status: DC | PRN
  Administered 2020-03-04: 100 mg via INTRAVENOUS

## 2020-03-04 MED ORDER — DEXMEDETOMIDINE HCL 200 MCG/2ML IV SOLN
INTRAVENOUS | Status: DC | PRN
  Administered 2020-03-04: 20 ug via INTRAVENOUS

## 2020-03-04 NOTE — Op Note (Signed)
[facility] Gastroenterology Patient Name: Billy Willis Procedure Date: 03/04/2020 8:15 AM MRN: [id number] Account #: [id number] Date of Birth: 29-May-1980 Admit Type: Outpatient Age: 40 Room: [facility] ENDO ROOM 4 Gender: Male Note Status: Finalized Procedure:             Colonoscopy Indications:           Hematochezia Providers:             Midge Minium MD, MD Referring MD:          Yehuda Mao. Birdie Sons (Referring MD) Medicines:             Propofol per Anesthesia Complications:         No immediate complications. Procedure:             Pre-Anesthesia Assessment:                        - Prior to the procedure, a History and Physical was                         performed, and patient medications and allergies were                         reviewed. The patient's tolerance of previous                         anesthesia was also reviewed. The risks and benefits                         of the procedure and the sedation options and risks                         were discussed with the patient. All questions were                         answered, and informed consent was obtained. Prior                         Anticoagulants: The patient has taken no previous                         anticoagulant or antiplatelet agents. ASA Grade                         Assessment: II - A patient with mild systemic disease.                         After reviewing the risks and benefits, the patient                         was deemed in satisfactory condition to undergo the                         procedure.                        After obtaining informed consent, the colonoscope was                         passed  under direct vision. Throughout the procedure,                         the patient's blood pressure, pulse, and oxygen                         saturations were monitored continuously. The                         Colonoscope was introduced through the anus and                          advanced to the the cecum, identified by appendiceal                         orifice and ileocecal valve. The colonoscopy was                         performed without difficulty. The patient tolerated                         the procedure well. The quality of the bowel                         preparation was excellent. Findings:      The perianal and digital rectal examinations were normal.      Non-bleeding internal hemorrhoids were found during retroflexion. The       hemorrhoids were Grade I (internal hemorrhoids that do not prolapse).      A single small-mouthed diverticulum was found in the sigmoid colon. Impression:            - Non-bleeding internal hemorrhoids.                        - Diverticulosis in the sigmoid colon.                        - No specimens collected. Recommendation:        - Discharge patient to home.                        - Continue present medications.                        - High fiber diet. Procedure Code(s):     --- Professional ---                        [phone], Colonoscopy, flexible; diagnostic, including                         collection of specimen(s) by brushing or washing, when                         performed (separate procedure) Diagnosis Code(s):     --- Professional ---                        K92.1, Melena (includes Hematochezia) CPT copyright 2019 American Medical Association. All rights reserved. The codes documented in this report are preliminary and upon coder review  may  be revised to meet current compliance requirements. Midge Minium MD, MD 03/04/2020 8:35:18 AM This report has been signed electronically. Number of Addenda: 0 Note Initiated On: 03/04/2020 8:15 AM Scope Withdrawal Time: 0 hours 6 minutes 39 seconds  Total Procedure Duration: 0 hours 10 minutes 5 seconds  Estimated Blood Loss:  Estimated blood loss: none.      [facility]

## 2020-03-04 NOTE — Anesthesia Preprocedure Evaluation (Signed)
Anesthesia Evaluation  Patient identified by MRN, date of birth, ID band Patient awake    Reviewed: Allergy & Precautions, H&P , NPO status , reviewed documented beta blocker date and time   Airway Mallampati: II  TM Distance: >3 FB     Dental  (+) Teeth Intact   Pulmonary    Pulmonary exam normal        Cardiovascular Normal cardiovascular exam     Neuro/Psych    GI/Hepatic neg GERD  ,  Endo/Other    Renal/GU Renal disease     Musculoskeletal   Abdominal   Peds  Hematology   Anesthesia Other Findings Past Medical History: No date: History of chicken pox 2015: Kidney stones  Past Surgical History: No date: WISDOM TOOTH EXTRACTION  BMI    Body Mass Index: 36.26 kg/m      Reproductive/Obstetrics                             Anesthesia Physical Anesthesia Plan  ASA: II  Anesthesia Plan: General   Post-op Pain Management:    Induction: Intravenous  PONV Risk Score and Plan: Treatment may vary due to age or medical condition and TIVA  Airway Management Planned: Nasal Cannula and Natural Airway  Additional Equipment:   Intra-op Plan:   Post-operative Plan:   Informed Consent: I have reviewed the patients History and Physical, chart, labs and discussed the procedure including the risks, benefits and alternatives for the proposed anesthesia with the patient or authorized representative who has indicated his/her understanding and acceptance.     Dental Advisory Given  Plan Discussed with: CRNA  Anesthesia Plan Comments:         Anesthesia Quick Evaluation

## 2020-03-04 NOTE — Transfer of Care (Signed)
Immediate Anesthesia Transfer of Care Note   Patient: Billy Willis  Procedure(s) Performed: COLONOSCOPY WITH PROPOFOL (N/A )  Patient Location: PACU  On Endoscopy Unit  Anesthesia Type:General  Level of Consciousness: drowsy  Airway & Oxygen Therapy: Patient Spontanous Breathing  Post-op Assessment: Report given to RN and Post -op Vital signs reviewed and stable  Post vital signs: Reviewed and stable  Last Vitals:  Vitals Value Taken Time  BP 101/61 03/04/20 0839  Temp    Pulse 75 03/04/20 0839  Resp 20 03/04/20 0839  SpO2 97 % 03/04/20 0839  Vitals shown include unvalidated device data.  Last Pain:  Vitals:   03/04/20 0837  TempSrc:   PainSc: Asleep         Complications: No complications documented.

## 2020-03-04 NOTE — H&P (Signed)
Lucilla Lame, MD [facility] [address] [place], [place] [address] Phone:[phone] Fax : [phone]  Primary Care Physician:  Leone Haven, MD Primary Gastroenterologist:  Dr. Allen Norris  Pre-Procedure History & Physical: HPI:  Billy Willis is a 40 y.o. male is here for an colonoscopy.   Past Medical History:  Diagnosis Date  . History of chicken pox   . Kidney stones 2015    Past Surgical History:  Procedure Laterality Date  . WISDOM TOOTH EXTRACTION      Prior to Admission medications   Medication Sig Start Date End Date Taking? Authorizing Provider  calcium-vitamin D (OSCAL WITH D) 500-200 MG-UNIT tablet Take 1 tablet by mouth.   Yes [provider]  clomiPHENE (CLOMID) 50 MG tablet Take 50 mg by mouth daily. Taken 1/2 tablet once per day   Yes [provider]  Na Sulfate-K Sulfate-Mg Sulf (SUPREP BOWEL PREP KIT) 17.5-3.13-1.6 GM/177ML SOLN Take 1 kit by mouth as directed. 01/14/20  Yes Lucilla Lame, MD    Allergies as of 01/14/2020  . (No Known Allergies)    Family History  Problem Relation Age of Onset  . Cancer Mother        ovary/uterine cancer  . Mental illness Father   . Early death Father   . Heart attack Father 33  . Arthritis Maternal Grandmother   . Arthritis Maternal Grandfather   . Stroke Paternal Grandmother   . Stroke Paternal Grandfather     Social History   Socioeconomic History  . Marital status: Married    Spouse name: Not on file  . Number of children: Not on file  . Years of education: Not on file  . Highest education level: Not on file  Occupational History  . Not on file  Tobacco Use  . Smoking status: Never Smoker  . Smokeless tobacco: Never Used  Vaping Use  . Vaping Use: Never used  Substance and Sexual Activity  . Alcohol use: Yes    Alcohol/week: 2.0 standard drinks    Types: 1 Cans of beer, 1 Shots of liquor per week  . Drug use: No  . Sexual activity: Not Currently    Birth  control/protection: Abstinence    Comment: not for a long time, had unprotected sex 15 years ago  Other Topics Concern  . Not on file  Social History Narrative  . Not on file   Social Determinants of Health   Financial Resource Strain: Not on file  Food Insecurity: Not on file  Transportation Needs: Not on file  Physical Activity: Not on file  Stress: Not on file  Social Connections: Not on file  Intimate Partner Violence: Not on file    Review of Systems: See HPI, otherwise negative ROS  Physical Exam: BP 126/77   Pulse 79   Temp (!) 96.7 F (35.9 C) (Temporal)   Resp 20   Ht _0  (1.803 m)   Wt 117.9 kg   SpO2 94%   BMI 36.26 kg/m  General:   Alert,  pleasant and cooperative in NAD Head:  Normocephalic and atraumatic. Neck:  Supple; no masses or thyromegaly. Lungs:  Clear throughout to auscultation.    Heart:  Regular rate and rhythm. Abdomen:  Soft, nontender and nondistended. Normal bowel sounds, without guarding, and without rebound.   Neurologic:  Alert and  oriented x4;  grossly normal neurologically.  Impression/Plan: Billy Willis is here for an colonoscopy to be performed for hematochezia  Risks, benefits, limitations, and  alternatives regarding  colonoscopy have been reviewed with the patient.  Questions have been answered.  All parties agreeable.   Lucilla Lame, MD  03/04/2020, 8:10 AM

## 2020-03-04 NOTE — Anesthesia Postprocedure Evaluation (Signed)
Anesthesia Post Note  Patient: Billy Willis  Procedure(s) Performed: COLONOSCOPY WITH PROPOFOL (N/A )  Patient location during evaluation: Endoscopy Anesthesia Type: General Level of consciousness: awake and alert Pain management: pain level controlled Vital Signs Assessment: post-procedure vital signs reviewed and stable Respiratory status: spontaneous breathing, nonlabored ventilation and respiratory function stable Cardiovascular status: blood pressure returned to baseline and stable Postop Assessment: no apparent nausea or vomiting Anesthetic complications: no   No complications documented.   Last Vitals:  Vitals:   03/04/20 0857 03/04/20 0907  BP: 121/72 114/78  Pulse: 68 65  Resp: 12 14  Temp:    SpO2: 95% 95%    Last Pain:  Vitals:   03/04/20 0907  TempSrc:   PainSc: 0-No pain                 Christia Reading

## 2020-03-05 ENCOUNTER — Encounter: Payer: Self-pay | Admitting: Gastroenterology

## 2020-05-27 ENCOUNTER — Ambulatory Visit: Payer: BC Managed Care – PPO | Admitting: Family Medicine

## 2020-07-22 DIAGNOSIS — E221 Hyperprolactinemia: Secondary | ICD-10-CM | POA: Diagnosis not present

## 2020-07-22 DIAGNOSIS — E230 Hypopituitarism: Secondary | ICD-10-CM | POA: Diagnosis not present

## 2020-08-05 DIAGNOSIS — E230 Hypopituitarism: Secondary | ICD-10-CM | POA: Diagnosis not present

## 2020-08-05 DIAGNOSIS — E221 Hyperprolactinemia: Secondary | ICD-10-CM | POA: Diagnosis not present

## 2020-08-06 DIAGNOSIS — L718 Other rosacea: Secondary | ICD-10-CM | POA: Diagnosis not present

## 2020-09-06 IMAGING — MR MRI HEAD WITHOUT AND WITH CONTRAST
12 of 18 series · 26 of 48 positions shown · IV contrast (gadavist)
Comparison: None.

CLINICAL DATA: Abnormal lab work.  Hyperprolactinemia.

EXAM:
MRI HEAD WITHOUT AND WITH CONTRAST
TECHNIQUE: Multiplanar, multiecho pulse sequences of the brain and surrounding
structures were obtained without and with intravenous contrast.
CONTRAST:  10 mL Gadavist.

[Series 5: T1 · sagittal · 5.0mm · 0.62mm/px · 2 of 25 slices shown]
[im 1/25]
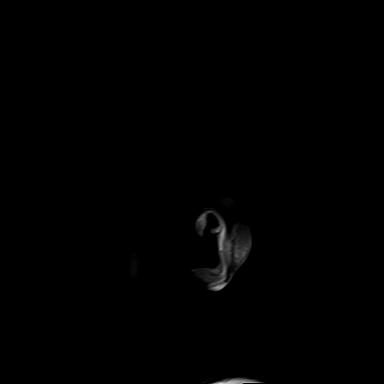
[im 25/25]
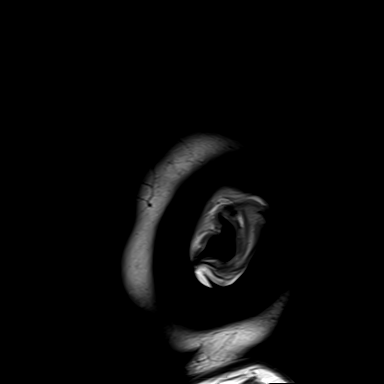

[Series 8: T2 · axial · 5.0mm · 0.53mm/px · z∈[-46,+110]mm · 2 of 27 slices shown]
[im 1/27]
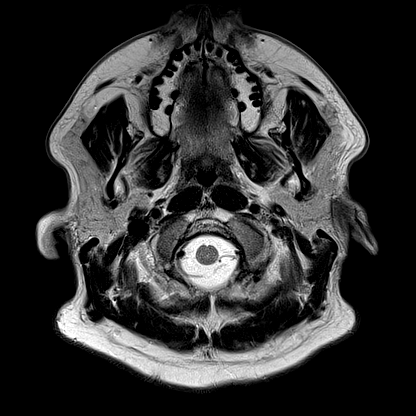
[im 27/27]
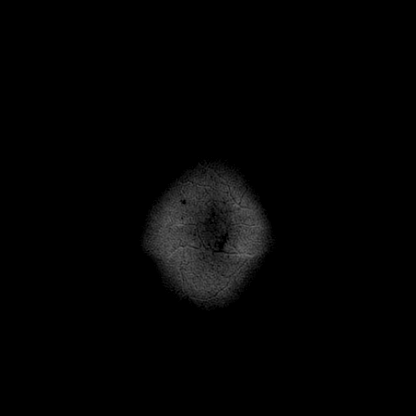

[Series 10: FLAIR · axial · 3.0mm · 0.53mm/px · z∈[-52,+110]mm · 5 of 55 slices shown]
[im 1/55]
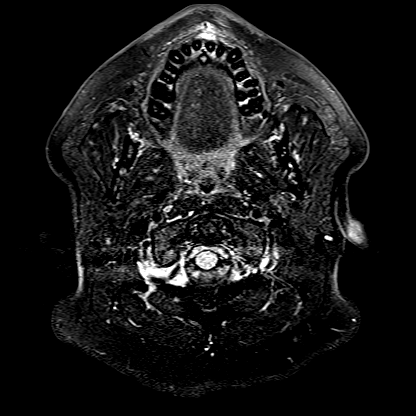
[im 14/55]
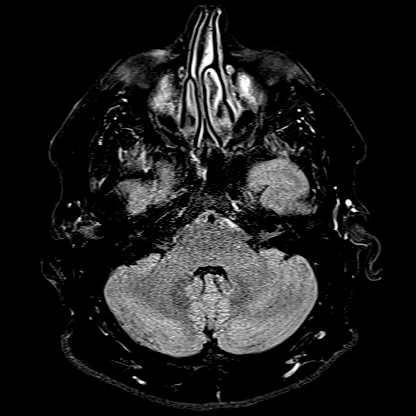
[im 28/55]
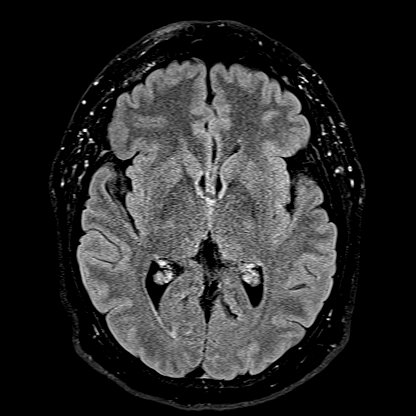
[im 41/55]
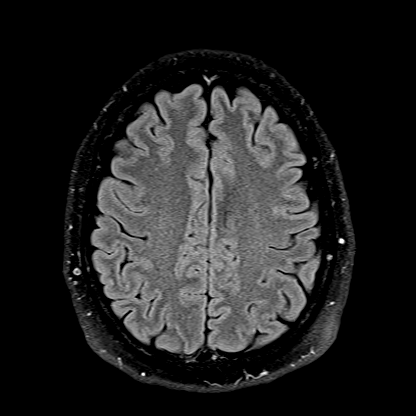
[im 55/55]
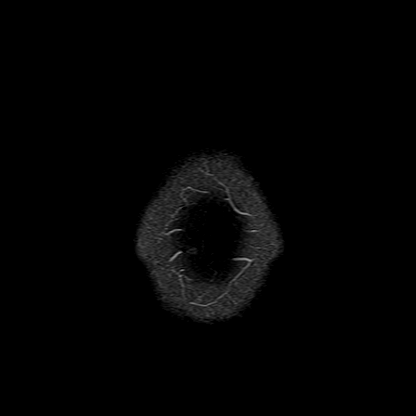

[Series 14: T1 post-contrast · coronal · 3.0mm · 0.28mm/px · 1 of 11 slices shown (1 of 9)]
[im 1/11]
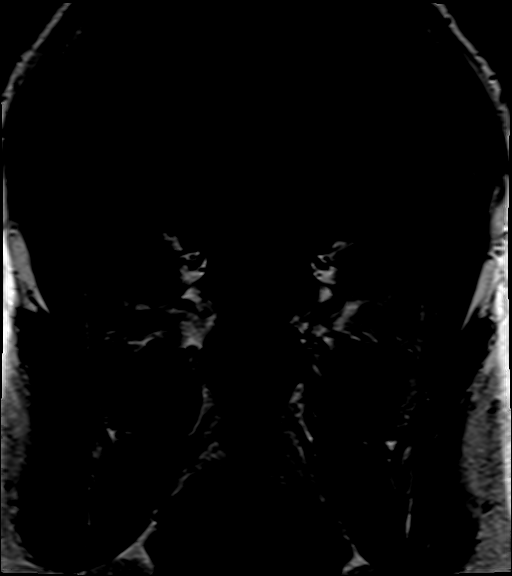

[Series 15: T1 post-contrast · coronal · 3.0mm · 0.28mm/px · 1 of 11 slices shown (2 of 9)]
[im 1/11]
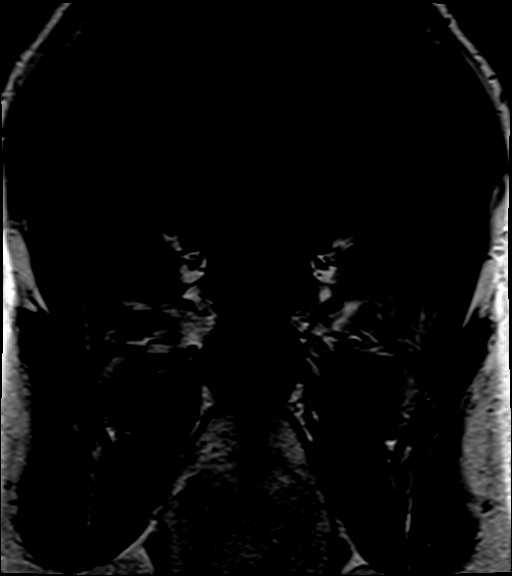

[Series 16: T1 post-contrast · coronal · 3.0mm · 0.28mm/px · 1 of 11 slices shown (3 of 9)]
[im 1/11]
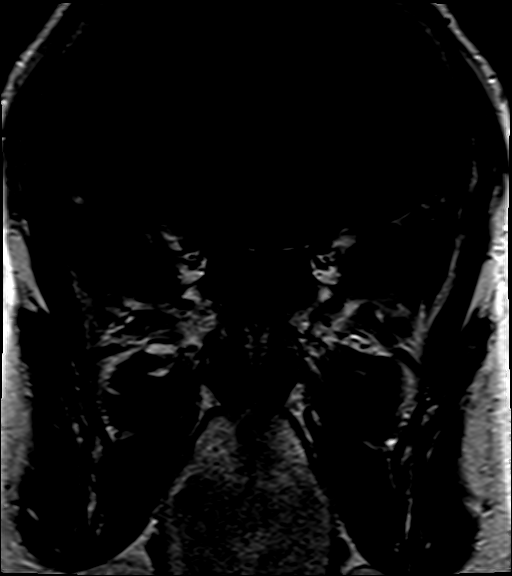

[Series 17: T1 post-contrast · coronal · 3.0mm · 0.28mm/px · 1 of 11 slices shown (4 of 9)]
[im 1/11]
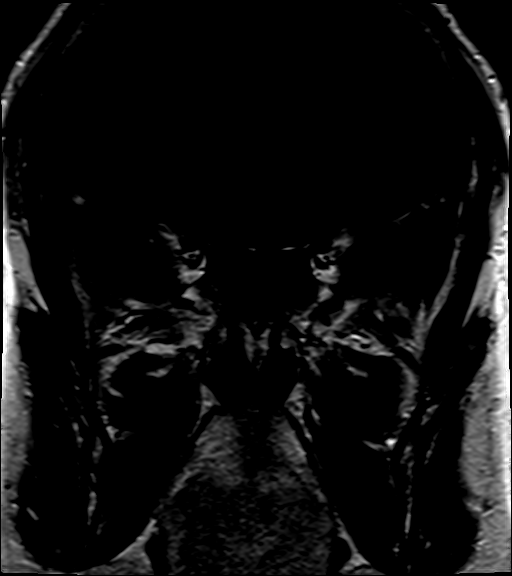

[Series 18: T1 post-contrast · coronal · 3.0mm · 0.28mm/px · 1 of 11 slices shown (5 of 9)]
[im 1/11]
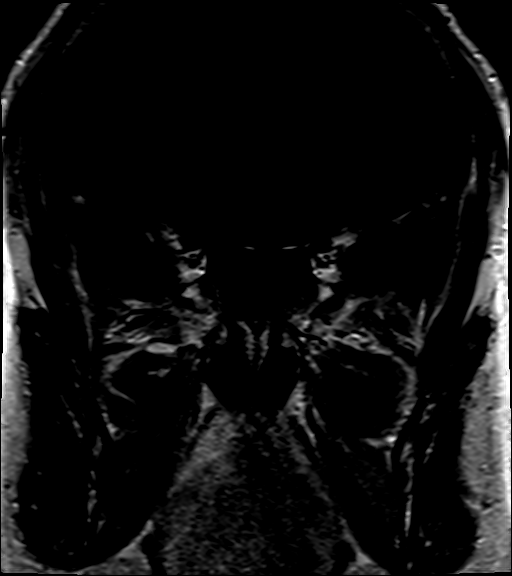

[Series 19: T1 post-contrast · coronal · 3.0mm · 0.21mm/px · 1 of 13 slices shown (6 of 9)]
[im 1/13]
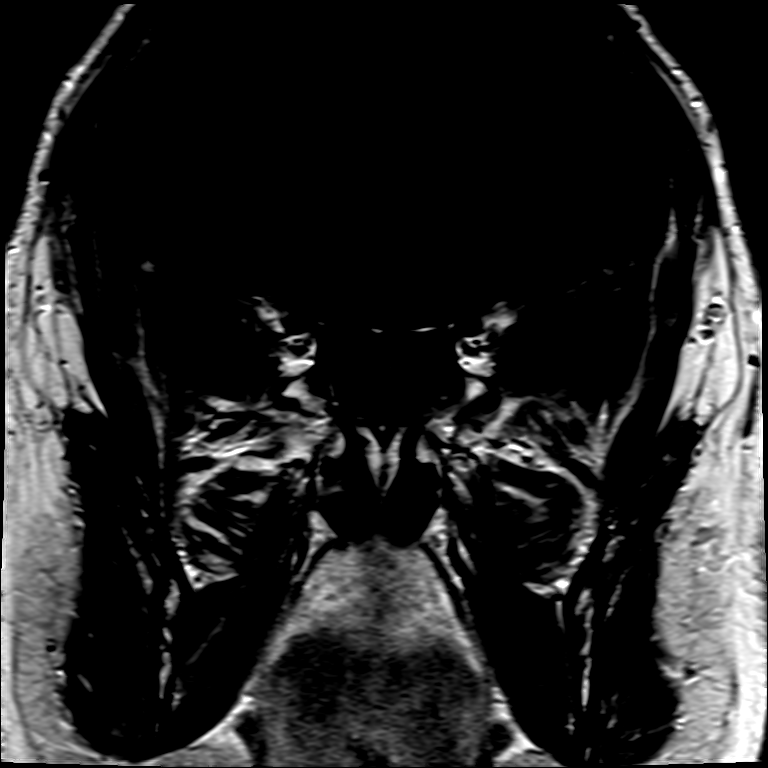

[Series 20: T1 post-contrast · sagittal · 3.0mm · 0.21mm/px · 1 of 13 slices shown (7 of 9)]
[im 1/13]
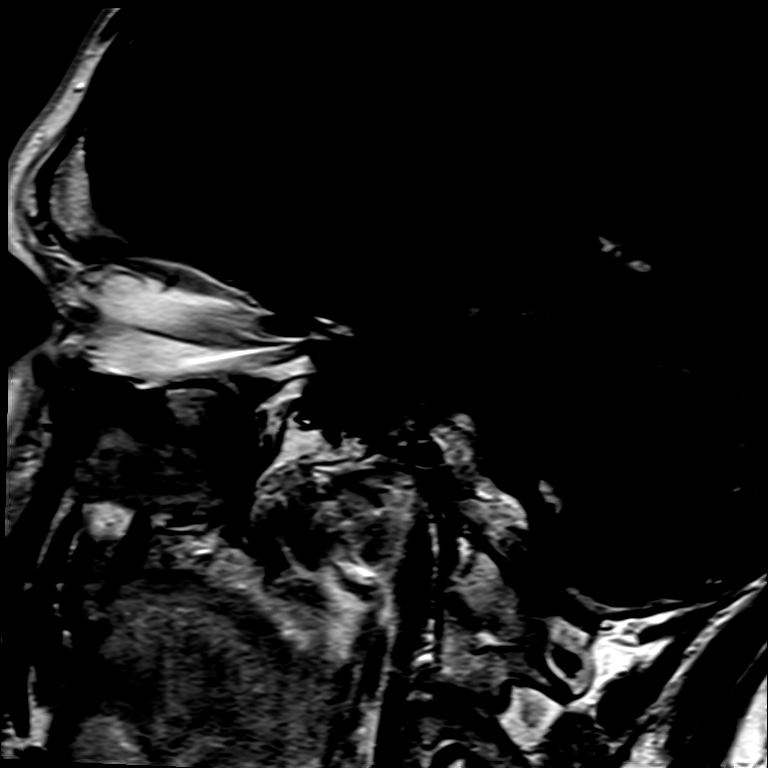

[Series 21: T1 post-contrast · axial · 1.0mm · 0.98mm/px · z∈[-55,+120]mm · 8 of 176 slices shown (8 of 9)]
[im 1/176]
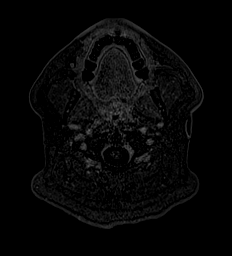
[im 26/176]
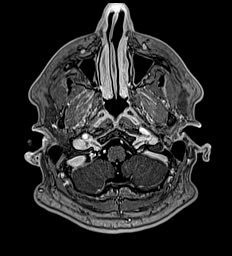
[im 51/176]
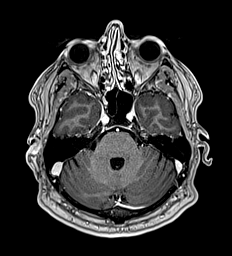
[im 76/176]
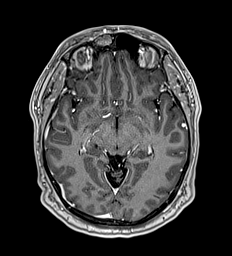
[im 101/176]
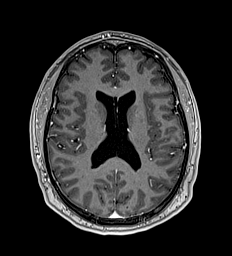
[im 126/176]
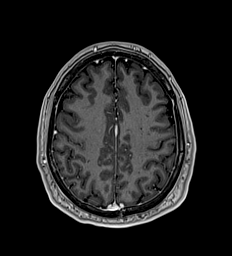
[im 151/176]
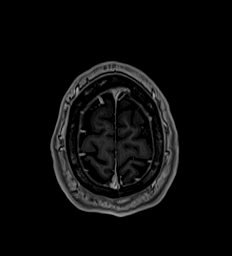
[im 176/176]
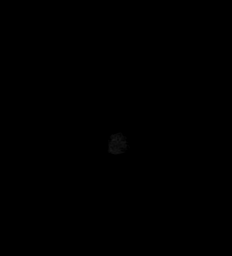

[Series 22: T1 post-contrast · coronal · 5.0mm · 0.57mm/px · 2 of 29 slices shown (9 of 9)]
[im 1/29]
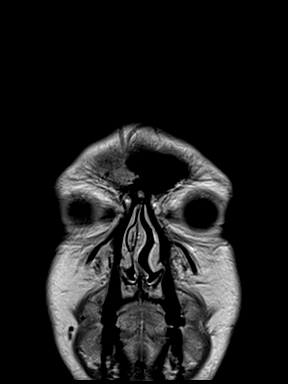
[im 29/29]
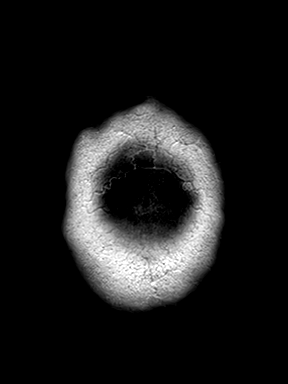

[26 of 48 positions shown; findings below may reference images not displayed]

FINDINGS: Brain: No evidence for acute infarction, hemorrhage, mass lesion,
hydrocephalus, or extra-axial fluid. Normal cerebral volume. No
white matter disease.

Coronal imaging through the sella turcica reveals normal gland
height. No areas of hypoenhancement. No parasellar mass. Normal
pituitary stalk. Normal cavernous sinuses. Unremarkable
hypothalamus.

Post infusion imaging through the entire head was normal,
demonstrating no abnormal enhancement of the brain or meninges.

Vascular: Flow voids are maintained throughout the carotid, basilar,
and vertebral arteries. There are no areas of chronic hemorrhage.

Skull and upper cervical spine: Unremarkable visualized calvarium,
skullbase, and cervical vertebrae. Pineal and cerebellar tonsils
unremarkable. No upper cervical cord lesions.

Sinuses/Orbits: No orbital masses or proptosis. Globes appear
symmetric. Sinuses appear well aerated, without evidence for
air-fluid level.

Other: None.
IMPRESSION: Negative exam. No acute intracranial findings. No abnormal
postcontrast enhancement.

No evidence of  pituitary or parasellar mass.

## 2020-10-07 ENCOUNTER — Encounter: Payer: Self-pay | Admitting: Family Medicine

## 2020-10-07 ENCOUNTER — Other Ambulatory Visit: Payer: Self-pay

## 2020-10-07 ENCOUNTER — Ambulatory Visit (INDEPENDENT_AMBULATORY_CARE_PROVIDER_SITE_OTHER): Payer: BC Managed Care – PPO | Admitting: Family Medicine

## 2020-10-07 VITALS — BP 120/80 | HR 73 | Temp 98.1°F | Ht 71.0 in | Wt 250.5 lb

## 2020-10-07 DIAGNOSIS — Z13220 Encounter for screening for lipoid disorders: Secondary | ICD-10-CM | POA: Diagnosis not present

## 2020-10-07 DIAGNOSIS — Z0000 Encounter for general adult medical examination without abnormal findings: Secondary | ICD-10-CM

## 2020-10-07 DIAGNOSIS — E669 Obesity, unspecified: Secondary | ICD-10-CM

## 2020-10-07 LAB — COMPREHENSIVE METABOLIC PANEL WITH GFR
ALT: 33 U/L (ref 0–53)
AST: 25 U/L (ref 0–37)
Albumin: 4.3 g/dL (ref 3.5–5.2)
Alkaline Phosphatase: 62 U/L (ref 39–117)
BUN: 11 mg/dL (ref 6–23)
CO2: 27 meq/L (ref 19–32)
Calcium: 9.4 mg/dL (ref 8.4–10.5)
Chloride: 102 meq/L (ref 96–112)
Creatinine, Ser: 0.93 mg/dL (ref 0.40–1.50)
GFR: 102.49 mL/min
Glucose, Bld: 75 mg/dL (ref 70–99)
Potassium: 4.3 meq/L (ref 3.5–5.1)
Sodium: 137 meq/L (ref 135–145)
Total Bilirubin: 1.4 mg/dL — ABNORMAL HIGH (ref 0.2–1.2)
Total Protein: 6.8 g/dL (ref 6.0–8.3)

## 2020-10-07 LAB — LIPID PANEL
Cholesterol: 197 mg/dL (ref 0–200)
HDL: 41.2 mg/dL (ref >39.00)
LDL Cholesterol: 129 mg/dL — ABNORMAL HIGH (ref 0–99)
NonHDL: 155.69
Total CHOL/HDL Ratio: 5
Triglycerides: 131 mg/dL (ref 0.0–149.0)
VLDL: 26.2 mg/dL (ref 0.0–40.0)

## 2020-10-07 LAB — HEMOGLOBIN A1C: Hgb A1c MFr Bld: 5.3 % (ref 4.6–6.5)

## 2020-10-07 NOTE — Patient Instructions (Signed)
Nice to see you. Please try to add in moderate intensity exercise for a total of 150 minutes/week. We will get lab work today and contact you with the results.

## 2020-10-07 NOTE — Progress Notes (Signed)
Billy Rumps, MD Phone: [phone]  Billy Willis is a 40 y.o. male who presents today for CPE.  Diet: Generally healthy, does drink 3 sodas a week. Exercise: No exercise currently, wants to get back to using weights and walking Colonoscopy: 03/04/2020, 10-year recall Prostate cancer screening: Not indicated Family history-  Prostate cancer: No  Colon cancer: No Vaccines-   Flu: Delayed until later in the season per patient preference  Tetanus: Up-to-date  COVID19: X2 HIV screening: Up-to-date Hep C Screening: Up-to-date Tobacco use: No  Alcohol use: no Illicit Drug use: no Dentist: yes Ophthalmology: yes   Active Ambulatory Problems    Diagnosis Date Noted   Routine general medical examination at a health care facility 05/07/2015   Decreased energy 05/24/2017   Scrotal lesion 05/24/2017   Ear pain, bilateral 05/24/2017   Jock itch 05/24/2017   Hypogonadism in male 09/26/2018   Loose stools 09/26/2018   Elevated ALT measurement 09/26/2018   Bright red blood per rectum 11/27/2019   Resolved Ambulatory Problems    Diagnosis Date Noted   Vasovagal syncope 05/07/2015   Lump in the testicle 05/07/2015   Left flank pain 07/16/2015   Past Medical History:  Diagnosis Date   History of chicken pox    Kidney stones 2015    Family History  Problem Relation Age of Onset   Cancer Mother        ovary/uterine cancer   Mental illness Father    Early death Father    Heart attack Father 27   Arthritis Maternal Grandmother    Arthritis Maternal Grandfather    Stroke Paternal Grandmother    Stroke Paternal Grandfather     Social History   Socioeconomic History   Marital status: Married    Spouse name: Not on file   Number of children: Not on file   Years of education: Not on file   Highest education level: Not on file  Occupational History   Not on file  Tobacco Use   Smoking status: Never   Smokeless tobacco: Never  Vaping Use   Vaping Use: Never used   Substance and Sexual Activity   Alcohol use: Yes    Alcohol/week: 2.0 standard drinks    Types: 1 Cans of beer, 1 Shots of liquor per week   Drug use: No   Sexual activity: Not Currently    Birth control/protection: Abstinence    Comment: not for a long time, had unprotected sex 15 years ago  Other Topics Concern   Not on file  Social History Narrative   Not on file   Social Determinants of Health   Financial Resource Strain: Not on file  Food Insecurity: Not on file  Transportation Needs: Not on file  Physical Activity: Not on file  Stress: Not on file  Social Connections: Not on file  Intimate Partner Violence: Not on file    ROS  General:  Negative for nexplained weight loss, fever Skin: Negative for new or changing mole, sore that won't heal HEENT: Negative for trouble hearing, trouble seeing, ringing in ears, mouth sores, hoarseness, change in voice, dysphagia. CV:  Negative for chest pain, dyspnea, edema, palpitations Resp: Negative for cough, dyspnea, hemoptysis GI: Negative for nausea, vomiting, diarrhea, constipation, abdominal pain, melena, hematochezia. GU: Negative for dysuria, incontinence, urinary hesitance, hematuria, vaginal or penile discharge, polyuria, sexual difficulty, lumps in testicle or breasts MSK: Negative for muscle cramps or aches, joint pain or swelling Neuro: Negative for headaches, weakness, numbness, dizziness, passing out/fainting  Psych: Negative for depression, anxiety, memory problems  Objective  Physical Exam Vitals:   10/07/20 0952  BP: 120/80  Pulse: 73  Temp: 98.1 F (36.7 C)  SpO2: 97%    BP Readings from Last 3 Encounters:  10/07/20 120/80  03/04/20 114/78  11/27/19 118/70   Wt Readings from Last 3 Encounters:  10/07/20 250 lb 8 oz (113.6 kg)  03/04/20 260 lb (117.9 kg)  11/27/19 267 lb 3.2 oz (121.2 kg)    Physical Exam Constitutional:      General: He is not in acute distress.    Appearance: He is not  diaphoretic.  HENT:     Head: Normocephalic and atraumatic.  Eyes:     Conjunctiva/sclera: Conjunctivae normal.     Pupils: Pupils are equal, round, and reactive to light.  Cardiovascular:     Rate and Rhythm: Normal rate and regular rhythm.     Heart sounds: Normal heart sounds.  Pulmonary:     Effort: Pulmonary effort is normal.     Breath sounds: Normal breath sounds.  Abdominal:     General: Bowel sounds are normal. There is no distension.     Palpations: Abdomen is soft.     Tenderness: There is no abdominal tenderness. There is no guarding or rebound.  Musculoskeletal:     Right lower leg: No edema.     Left lower leg: No edema.  Lymphadenopathy:     Cervical: No cervical adenopathy.  Skin:    General: Skin is warm and dry.  Neurological:     Mental Status: He is alert.  Psychiatric:        Mood and Affect: Mood normal.     Assessment/Plan:   Problem List Items Addressed This Visit     Routine general medical examination at a health care facility - Primary    Physical exam completed.  Encouraged healthy diet.  Discussed adding in exercise for a goal of 150 minutes/week of moderate intensity exercise.  Offered flu vaccine though he defers this to later in the season.  I encouraged him to get a COVID booster vaccine.  Lab work as outlined.  His wife did ask about who they would see for evaluation for fertility if they are unable to get pregnant in an adequate amount of time.  I discussed that this would likely be the gynecologist to start the work-up and then a fertility specialist after that.      Other Visit Diagnoses     Obesity (BMI 30.0-34.9)       Relevant Orders   HgB A1c   Lipid screening       Relevant Orders   Lipid panel   Comp Met (CMET)       Return in about 1 year (around 10/07/2021) for CPE.  This visit occurred during the SARS-CoV-2 public health emergency.  Safety protocols were in place, including screening questions prior to the visit,  additional usage of staff PPE, and extensive cleaning of exam room while observing appropriate contact time as indicated for disinfecting solutions.    Billy Rumps, MD [place]

## 2020-10-07 NOTE — Assessment & Plan Note (Addendum)
Physical exam completed.  Encouraged healthy diet.  Discussed adding in exercise for a goal of 150 minutes/week of moderate intensity exercise.  Offered flu vaccine though he defers this to later in the season.  I encouraged him to get a COVID booster vaccine.  Lab work as outlined.  His wife did ask about who they would see for evaluation for fertility if they are unable to get pregnant in an adequate amount of time.  I discussed that this would likely be the gynecologist to start the work-up and then a fertility specialist after that.

## 2020-10-13 ENCOUNTER — Other Ambulatory Visit: Payer: Self-pay | Admitting: Family Medicine

## 2020-11-04 ENCOUNTER — Other Ambulatory Visit: Payer: Self-pay

## 2020-11-04 ENCOUNTER — Other Ambulatory Visit (INDEPENDENT_AMBULATORY_CARE_PROVIDER_SITE_OTHER): Payer: BC Managed Care – PPO

## 2020-11-04 LAB — HEPATIC FUNCTION PANEL
ALT: 32 U/L (ref 0–53)
AST: 21 U/L (ref 0–37)
Albumin: 4.5 g/dL (ref 3.5–5.2)
Alkaline Phosphatase: 62 U/L (ref 39–117)
Bilirubin, Direct: 0.2 mg/dL (ref 0.0–0.3)
Total Bilirubin: 1.4 mg/dL — ABNORMAL HIGH (ref 0.2–1.2)
Total Protein: 6.9 g/dL (ref 6.0–8.3)

## 2020-11-06 ENCOUNTER — Other Ambulatory Visit: Payer: Self-pay | Admitting: Family Medicine

## 2020-11-14 ENCOUNTER — Telehealth: Payer: Self-pay | Admitting: Family Medicine

## 2020-11-14 NOTE — Telephone Encounter (Signed)
I called and spoke with the patients wife and she rescheduled the lab appointment for 4 pm the same date.  Moises Terpstra,cma

## 2020-11-14 NOTE — Telephone Encounter (Signed)
Patients wife is calling in to see if he will have to fast before his 10/11 lab appointment and the would like to change his appointment if it is fasting.Please advise.

## 2020-11-17 ENCOUNTER — Other Ambulatory Visit: Payer: Self-pay

## 2020-11-17 ENCOUNTER — Other Ambulatory Visit (INDEPENDENT_AMBULATORY_CARE_PROVIDER_SITE_OTHER): Payer: BC Managed Care – PPO

## 2020-11-17 LAB — RETICULOCYTES
ABS Retic: 98420 cells/uL — ABNORMAL HIGH (ref 25000–90000)
Retic Ct Pct: 1.9 %

## 2020-11-18 ENCOUNTER — Other Ambulatory Visit: Payer: BC Managed Care – PPO

## 2020-11-18 LAB — CBC
HCT: 43.6 % (ref 39.0–52.0)
Hemoglobin: 14.8 g/dL (ref 13.0–17.0)
MCHC: 33.9 g/dL (ref 30.0–36.0)
MCV: 86.7 fl (ref 78.0–100.0)
Platelets: 227 10*3/uL (ref 150.0–400.0)
RBC: 5.04 Mil/uL (ref 4.22–5.81)
RDW: 13.1 % (ref 11.5–15.5)
WBC: 9.4 10*3/uL (ref 4.0–10.5)

## 2020-11-18 LAB — HAPTOGLOBIN: Haptoglobin: 103 mg/dL (ref 43–212)

## 2020-12-04 ENCOUNTER — Other Ambulatory Visit: Payer: Self-pay | Admitting: Family Medicine

## 2020-12-04 DIAGNOSIS — R701 Abnormal plasma viscosity: Secondary | ICD-10-CM

## 2021-01-02 IMAGING — US US ABDOMEN LIMITED
1 series · 14 of 25 positions shown · non-contrast
Comparison: None.

CLINICAL DATA: Abnormal LFTs

EXAM:
ULTRASOUND ABDOMEN LIMITED RIGHT UPPER QUADRANT

[Series 1: us abdomen limited · 14 of 47 slices shown]
[im 1/47]
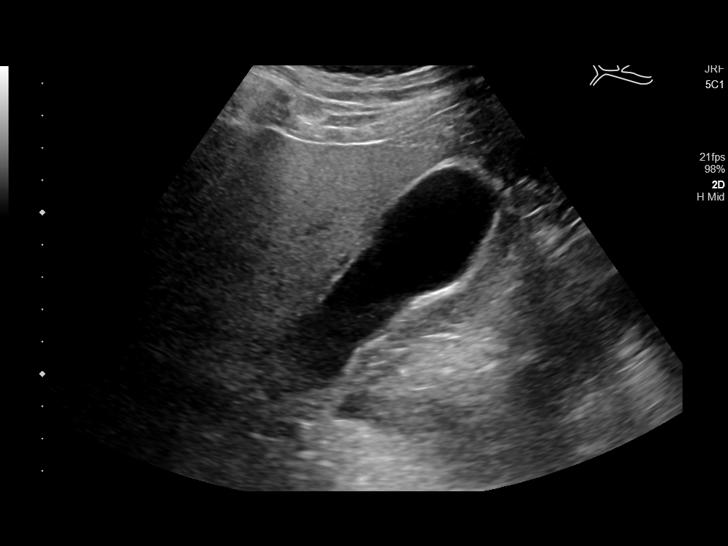
[im 4/47]
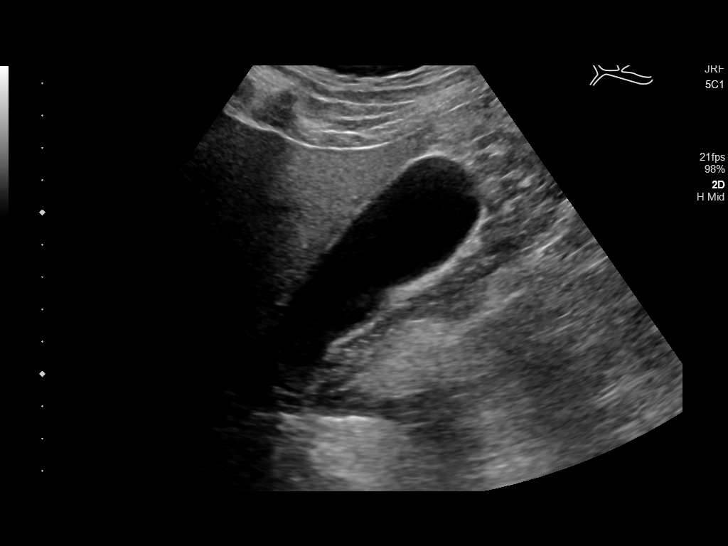
[im 8/47]
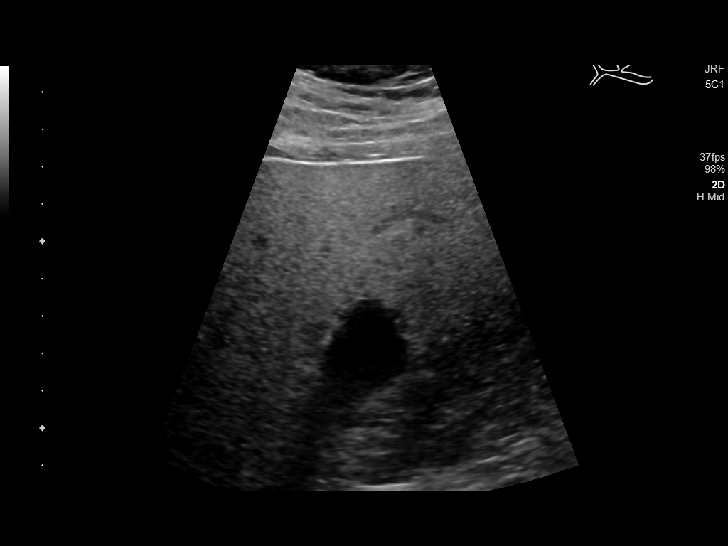
[im 12/47]
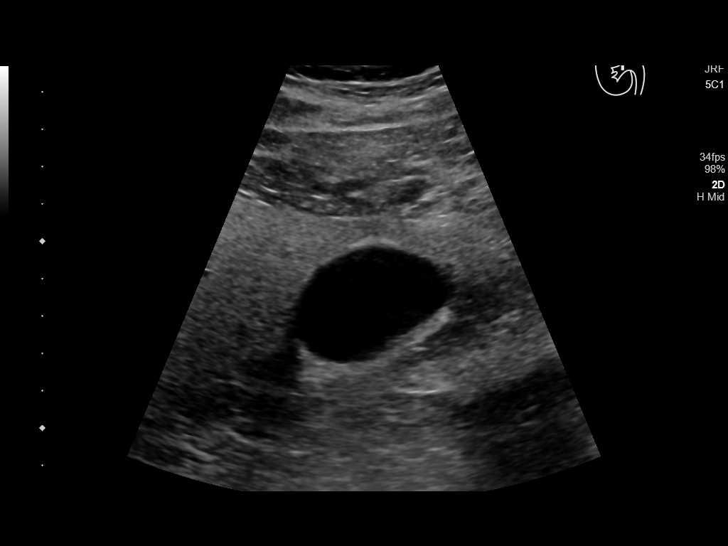
[im 16/47]
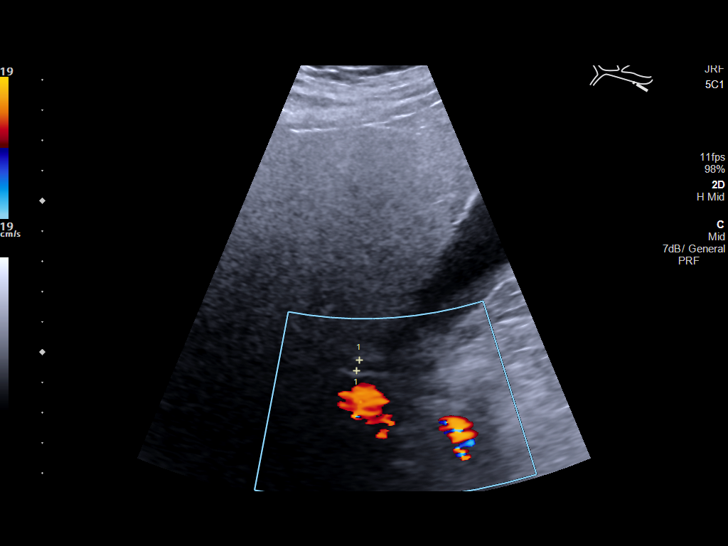
[im 18/47]
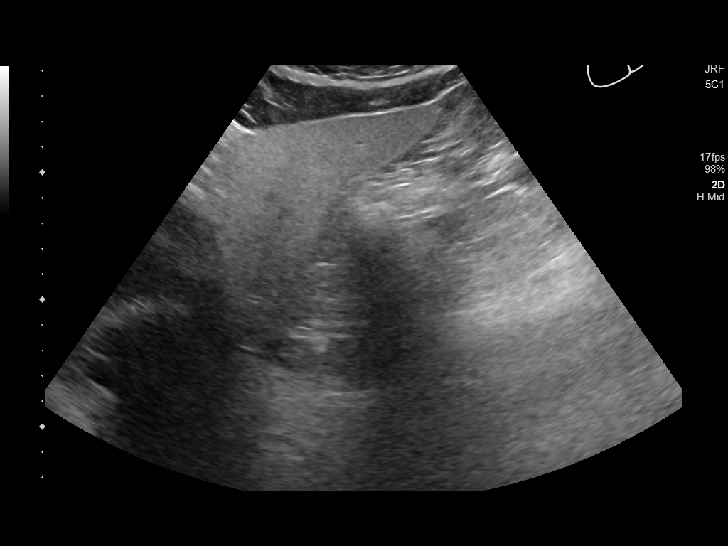
[im 22/47]
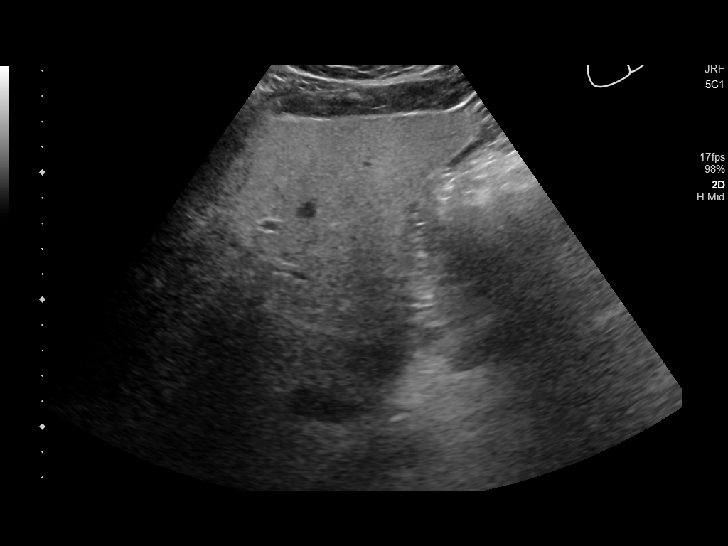
[im 25/47]
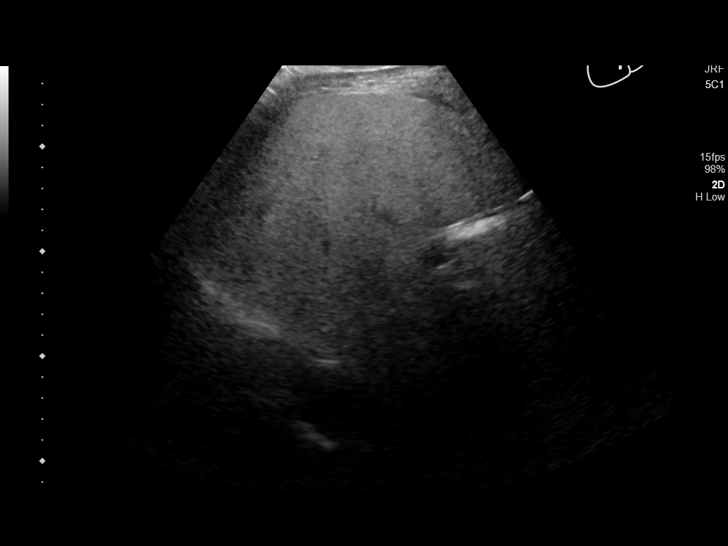
[im 29/47]
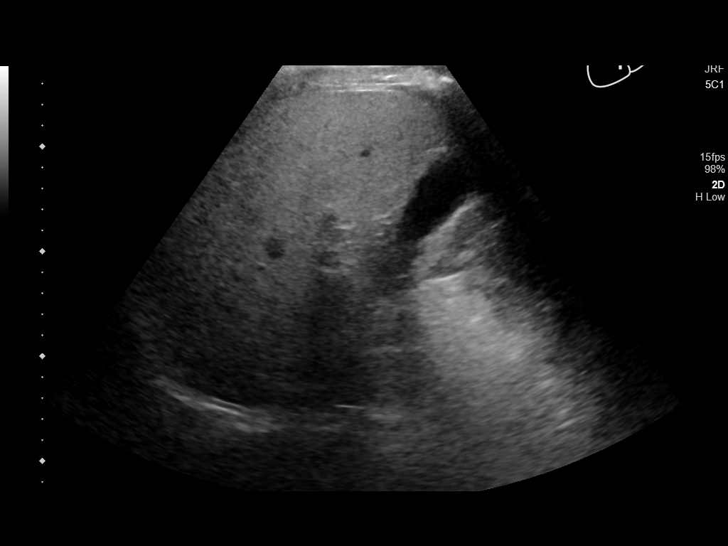
[im 31/47]
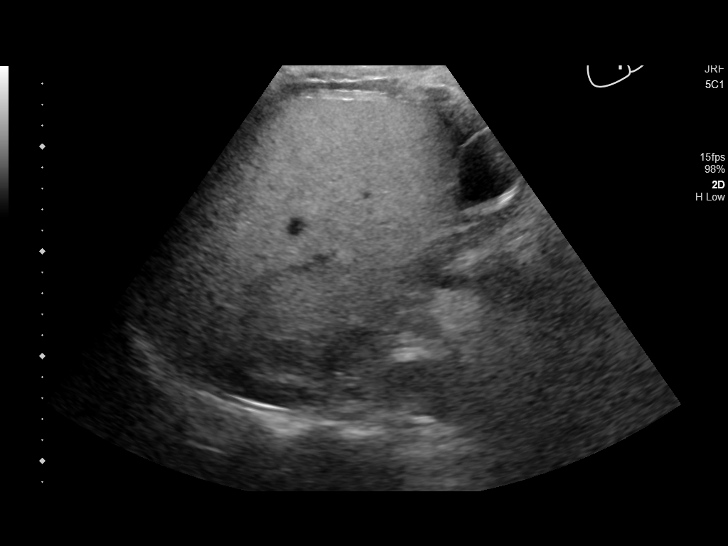
[im 35/47]
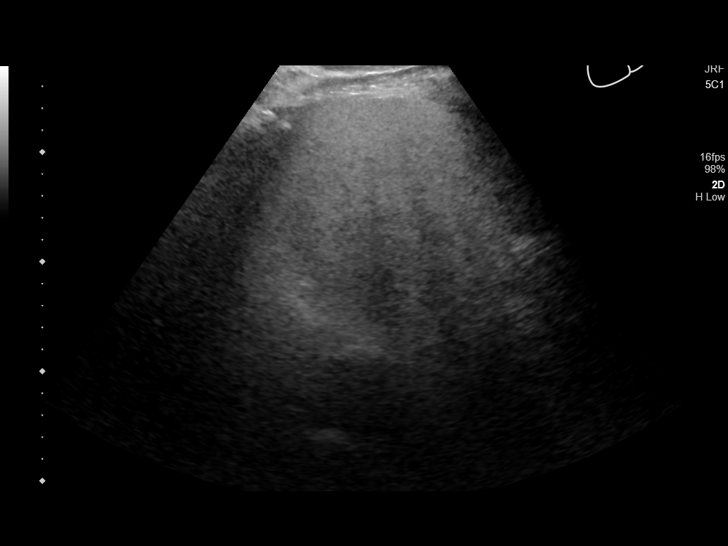
[im 39/47]
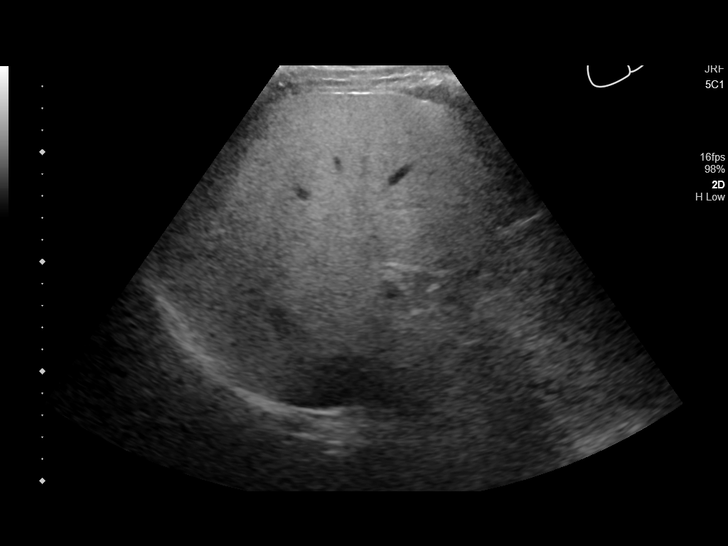
[im 43/47]
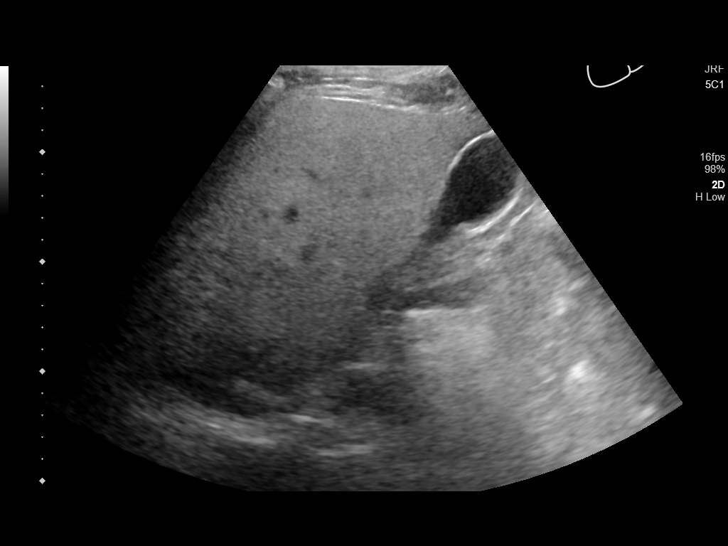
[im 47/47]
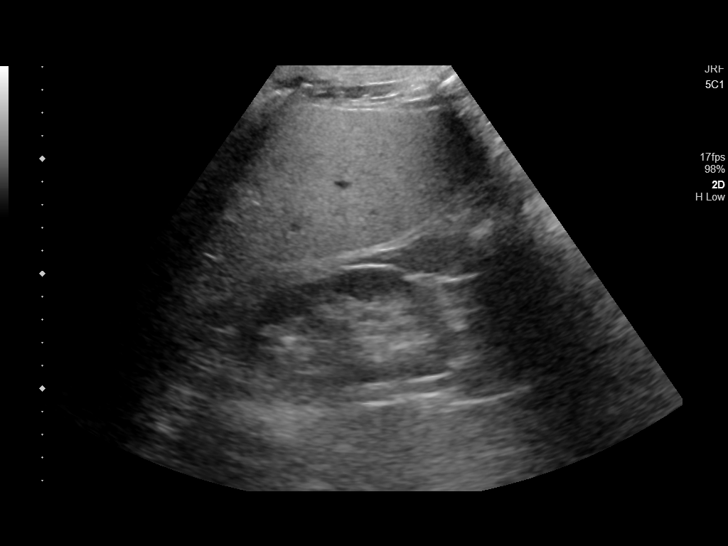

[14 of 25 positions shown; findings below may reference images not displayed]

FINDINGS: Gallbladder:

No gallstones or wall thickening visualized. No sonographic Murphy
sign noted by sonographer.

Common bile duct:

Diameter: 0.4 cm

Liver:

Diffuse increased echogenicity with slightly heterogeneous liver.
Appearance typically secondary to fatty infiltration. Fibrosis
secondary consideration. No secondary findings of cirrhosis noted.
No focal hepatic lesion or intrahepatic biliary duct dilatation.
Portal vein is patent on color Doppler imaging with normal direction
of blood flow towards the liver.

Other: None.
IMPRESSION: 1. No acute sonographic abnormality detected.
2. Hepatic steatosis.

## 2021-01-03 NOTE — Telephone Encounter (Signed)
Message with the phone number to hematology was given to patient to call and schedule his appointment.  Jyla Hopf,cma

## 2021-01-22 ENCOUNTER — Encounter: Payer: Self-pay | Admitting: *Deleted

## 2021-01-27 ENCOUNTER — Other Ambulatory Visit: Payer: Self-pay

## 2021-01-27 ENCOUNTER — Encounter: Payer: Self-pay | Admitting: Oncology

## 2021-01-27 ENCOUNTER — Inpatient Hospital Stay: Payer: BC Managed Care – PPO

## 2021-01-27 ENCOUNTER — Inpatient Hospital Stay: Payer: BC Managed Care – PPO | Attending: Oncology | Admitting: Oncology

## 2021-01-27 ENCOUNTER — Other Ambulatory Visit: Payer: Self-pay | Admitting: Oncology

## 2021-01-27 VITALS — BP 145/80 | HR 89 | Temp 98.2°F | Resp 16 | Wt 257.0 lb

## 2021-01-27 DIAGNOSIS — Z8616 Personal history of COVID-19: Secondary | ICD-10-CM | POA: Diagnosis not present

## 2021-01-27 DIAGNOSIS — R701 Abnormal plasma viscosity: Secondary | ICD-10-CM

## 2021-01-27 DIAGNOSIS — Z8261 Family history of arthritis: Secondary | ICD-10-CM | POA: Insufficient documentation

## 2021-01-27 DIAGNOSIS — Z8249 Family history of ischemic heart disease and other diseases of the circulatory system: Secondary | ICD-10-CM | POA: Diagnosis not present

## 2021-01-27 DIAGNOSIS — Z823 Family history of stroke: Secondary | ICD-10-CM | POA: Diagnosis not present

## 2021-01-27 DIAGNOSIS — Z818 Family history of other mental and behavioral disorders: Secondary | ICD-10-CM | POA: Diagnosis not present

## 2021-01-27 DIAGNOSIS — Z87442 Personal history of urinary calculi: Secondary | ICD-10-CM | POA: Diagnosis not present

## 2021-01-27 DIAGNOSIS — K760 Fatty (change of) liver, not elsewhere classified: Secondary | ICD-10-CM | POA: Diagnosis not present

## 2021-01-27 DIAGNOSIS — Z8041 Family history of malignant neoplasm of ovary: Secondary | ICD-10-CM | POA: Diagnosis not present

## 2021-01-27 DIAGNOSIS — D649 Anemia, unspecified: Secondary | ICD-10-CM

## 2021-01-27 DIAGNOSIS — Z79899 Other long term (current) drug therapy: Secondary | ICD-10-CM | POA: Diagnosis not present

## 2021-01-27 DIAGNOSIS — R17 Unspecified jaundice: Secondary | ICD-10-CM

## 2021-01-27 LAB — RETIC PANEL
Immature Retic Fract: 20.4 % — ABNORMAL HIGH (ref 2.3–15.9)
RBC.: 4.79 MIL/uL (ref 4.22–5.81)
Retic Count, Absolute: 90.1 10*3/uL (ref 19.0–186.0)
Retic Ct Pct: 1.9 % (ref 0.4–3.1)
Reticulocyte Hemoglobin: 31.7 pg (ref >27.9)

## 2021-01-27 LAB — CBC WITH DIFFERENTIAL/PLATELET
Abs Immature Granulocytes: 0.09 10*3/uL — ABNORMAL HIGH (ref 0.00–0.07)
Basophils Absolute: 0 10*3/uL (ref 0.0–0.1)
Basophils Relative: 1 %
Eosinophils Absolute: 0.3 10*3/uL (ref 0.0–0.5)
Eosinophils Relative: 4 %
HCT: 43 % (ref 39.0–52.0)
Hemoglobin: 14.4 g/dL (ref 13.0–17.0)
Immature Granulocytes: 1 %
Lymphocytes Relative: 23 %
Lymphs Abs: 1.9 10*3/uL (ref 0.7–4.0)
MCH: 29.4 pg (ref 26.0–34.0)
MCHC: 33.5 g/dL (ref 30.0–36.0)
MCV: 87.8 fL (ref 80.0–100.0)
Monocytes Absolute: 0.8 10*3/uL (ref 0.1–1.0)
Monocytes Relative: 10 %
Neutro Abs: 5.1 10*3/uL (ref 1.7–7.7)
Neutrophils Relative %: 61 %
Platelets: 268 10*3/uL (ref 150–400)
RBC: 4.9 MIL/uL (ref 4.22–5.81)
RDW: 13.2 % (ref 11.5–15.5)
WBC: 8.3 10*3/uL (ref 4.0–10.5)
nRBC: 0 % (ref 0.0–0.2)

## 2021-01-27 LAB — HEPATIC FUNCTION PANEL
ALT: 40 U/L (ref 0–44)
AST: 26 U/L (ref 15–41)
Albumin: 4.3 g/dL (ref 3.5–5.0)
Alkaline Phosphatase: 62 U/L (ref 38–126)
Bilirubin, Direct: 0.1 mg/dL (ref 0.0–0.2)
Indirect Bilirubin: 1.3 mg/dL — ABNORMAL HIGH (ref 0.3–0.9)
Total Bilirubin: 1.4 mg/dL — ABNORMAL HIGH (ref 0.3–1.2)
Total Protein: 7.7 g/dL (ref 6.5–8.1)

## 2021-01-27 NOTE — Progress Notes (Signed)
Hematology/Oncology Consult note [phone] [phone]      Patient Care Team: Glori Luis, MD as PCP - General (Family Medicine)  REFERRING PROVIDER: Glori Luis, MD  CHIEF COMPLAINTS/REASON FOR VISIT:  Evaluation of reticulocytosis  HISTORY OF PRESENTING ILLNESS:   Billy Willis is a  40 y.o.  male with PMH listed below was seen in consultation at the request of  Glori Luis, MD  for evaluation of reticulocytosis  11/18/18/2022, patient had a CBC done, patient has a normal hemoglobin of 14.8.  Normal WBC 9.4, normal platelet count 227,000.  There was increased reticulocyte count.  Haptoglobin was at 103.  Patient reports feeling well.  No unintentional weight loss, night sweats, fever. Drinks alcohol occasionally.  Non-smoker.  Was noted that patient has a history of fatty liver disease, nonalcoholic. He also has a chronic history of elevated bilirubin. History of renal stone.  Review of Systems  Constitutional:  Negative for appetite change, chills, fatigue, fever and unexpected weight change.  HENT:   Negative for hearing loss and voice change.   Eyes:  Negative for eye problems and icterus.  Respiratory:  Negative for chest tightness, cough and shortness of breath.   Cardiovascular:  Negative for chest pain and leg swelling.  Gastrointestinal:  Negative for abdominal distention and abdominal pain.  Endocrine: Negative for hot flashes.  Genitourinary:  Negative for difficulty urinating, dysuria and frequency.   Musculoskeletal:  Negative for arthralgias.  Skin:  Negative for itching and rash.  Neurological:  Negative for light-headedness and numbness.  Hematological:  Negative for adenopathy. Does not bruise/bleed easily.  Psychiatric/Behavioral:  Negative for confusion.    MEDICAL HISTORY:  Past Medical History:  Diagnosis Date   Elevated bilirubin    Fatty liver disease, nonalcoholic    History of chicken pox    Kidney  stones 02/08/2013   Reticulocytosis     SURGICAL HISTORY: Past Surgical History:  Procedure Laterality Date   COLONOSCOPY WITH PROPOFOL N/A 03/04/2020   Procedure: COLONOSCOPY WITH PROPOFOL;  Surgeon: Midge Minium, MD;  Location: [facility] ENDOSCOPY;  Service: Endoscopy;  Laterality: N/A;  COVID POSITIVE 02/01/2021   WISDOM TOOTH EXTRACTION      SOCIAL HISTORY: Social History   Socioeconomic History   Marital status: Married    Spouse name: Not on file   Number of children: Not on file   Years of education: Not on file   Highest education level: Not on file  Occupational History   Not on file  Tobacco Use   Smoking status: Never   Smokeless tobacco: Never  Vaping Use   Vaping Use: Never used  Substance and Sexual Activity   Alcohol use: Yes    Alcohol/week: 1.0 standard drink    Types: 1 Cans of beer per week    Comment: holiday liquor once   Drug use: No   Sexual activity: Yes    Comment: not for a long time, had unprotected sex 15 years ago  Other Topics Concern   Not on file  Social History Narrative   Not on file   Social Determinants of Health   Financial Resource Strain: Not on file  Food Insecurity: Not on file  Transportation Needs: Not on file  Physical Activity: Not on file  Stress: Not on file  Social Connections: Not on file  Intimate Partner Violence: Not on file    FAMILY HISTORY: Family History  Problem Relation Age of Onset   Cancer Mother  ovary/uterine cancer   Mental illness Father    Early death Father    Heart attack Father 59   Arthritis Maternal Grandmother    Arthritis Maternal Grandfather    Stroke Paternal Grandmother    Stroke Paternal Grandfather     ALLERGIES:  has No Known Allergies.  MEDICATIONS:  Current Outpatient Medications  Medication Sig Dispense Refill   clomiPHENE (CLOMID) 50 MG tablet Take 50 mg by mouth every other day. Taken 1/2 tablet once per day     No current facility-administered medications for  this visit.     PHYSICAL EXAMINATION: ECOG PERFORMANCE STATUS: 0 - Asymptomatic Vitals:   01/27/21 1015  BP: (!) 145/80  Pulse: 89  Resp: 16  Temp: 98.2 F (36.8 C)   Filed Weights   01/27/21 1015  Weight: 257 lb (116.6 kg)    Physical Exam Constitutional:      General: He is not in acute distress.    Appearance: He is obese.  HENT:     Head: Normocephalic and atraumatic.  Eyes:     General: No scleral icterus. Cardiovascular:     Rate and Rhythm: Normal rate and regular rhythm.     Heart sounds: Normal heart sounds.  Pulmonary:     Effort: Pulmonary effort is normal. No respiratory distress.     Breath sounds: No wheezing.  Abdominal:     General: Bowel sounds are normal. There is no distension.     Palpations: Abdomen is soft.  Musculoskeletal:        General: No deformity. Normal range of motion.     Cervical back: Normal range of motion and neck supple.  Skin:    General: Skin is warm and dry.     Findings: No erythema or rash.  Neurological:     Mental Status: He is alert and oriented to person, place, and time. Mental status is at baseline.     Cranial Nerves: No cranial nerve deficit.     Coordination: Coordination normal.  Psychiatric:        Mood and Affect: Mood normal.    LABORATORY DATA:  I have reviewed the data as listed Lab Results  Component Value Date   WBC 8.3 01/27/2021   HGB 14.4 01/27/2021   HCT 43.0 01/27/2021   MCV 87.8 01/27/2021   PLT 268 01/27/2021   Recent Labs    10/07/20 1003 11/04/20 0934 01/27/21 1055  NA 137  --   --   K 4.3  --   --   CL 102  --   --   CO2 27  --   --   GLUCOSE 75  --   --   BUN 11  --   --   CREATININE 0.93  --   --   CALCIUM 9.4  --   --   PROT 6.8 6.9 7.7  ALBUMIN 4.3 4.5 4.3  AST 25 21 26   ALT 33 32 40  ALKPHOS 62 62 62  BILITOT 1.4* 1.4* 1.4*  BILIDIR  --  0.2 0.1  IBILI  --   --  1.3*   Iron/TIBC/Ferritin/ %Sat    Component Value Date/Time   IRON 68 12/27/2018 0943   TIBC 326  12/27/2018 0943   FERRITIN 287 12/27/2018 0943   IRONPCTSAT 21 12/27/2018 0943      RADIOGRAPHIC STUDIES: I have personally reviewed the radiological images as listed and agreed with the findings in the report. No results found. 10/30/2018, ultrasound abdomen limited  showed hepatic steatosis.   ASSESSMENT & PLAN:  1. Elevated bilirubin   2. Reticulocytosis   3. Sullivan Lone syndrome    #Labs reviewed and discussed with patient. He has normal hemoglobin, very slight increase of reticulocytes, which may indicate mild hemolysis process.  Chronic elevated bilirubin, predominantly indirect bilirubin.  Could be due to hemolysis or he may have Gilbert syndrome- Check UGT1A1 mutation.  Labs are reviewed and discussed with patient. 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  Increased immature retic fraction, no signs of severe hemolysis- [ normal indirect bili, normal hemoglobin].    Orders Placed This Encounter  Procedures   CBC with Differential/Platelet    Standing Status:   Future    Number of Occurrences:   1    Standing Expiration Date:   01/27/2022   Hepatic function panel    Standing Status:   Future    Number of Occurrences:   1    Standing Expiration Date:   01/27/2022   Retic Panel    Standing Status:   Future    Number of Occurrences:   1    Standing Expiration Date:   01/27/2022   Miscellaneous [facility] test (send-out)    Standing Status:   Future    Number of Occurrences:   1    Standing Expiration Date:   01/27/2022    Order Specific Question:   Test name / description:    Answer:   UGT1A1 Irinotecan toxcicity [id number]    All questions were answered. The patient knows to call the clinic with any problems questions or concerns.   Glori Luis, MD    Return of visit:  Thank you for this kind referral and the opportunity to participate in the care of this patient. A copy of today's note is routed to referring provider   Rickard Patience, MD,  PhD [facility] Hematology Oncology 01/27/2021

## 2021-01-27 NOTE — Progress Notes (Signed)
Pt new and no concerns. Has on and off kidney pain on right side no pain today

## 2021-02-04 ENCOUNTER — Encounter: Payer: Self-pay | Admitting: Oncology

## 2021-02-04 NOTE — Telephone Encounter (Signed)
Please advise 

## 2021-02-10 LAB — MISC LABCORP TEST (SEND OUT): Labcorp test code: 511200

## 2021-02-12 ENCOUNTER — Telehealth: Payer: Self-pay

## 2021-02-12 NOTE — Telephone Encounter (Signed)
-----   Message from Rickard Patience, MD sent at 02/11/2021  9:20 PM EST ----- My chart message sent. Please arrange him to check additional blood work.  Ordered.

## 2021-02-12 NOTE — Telephone Encounter (Signed)
Dr. Cathie Hoops has sent pt mychart message with lab results. Please contact pt to schedule labs, per pt availability.

## 2021-02-12 NOTE — Telephone Encounter (Signed)
Left VM for pt to call back to scheduled labs.

## 2021-02-13 NOTE — Telephone Encounter (Signed)
Sent pt Mychart message to follow up.

## 2021-02-16 ENCOUNTER — Telehealth: Payer: Self-pay | Admitting: Oncology

## 2021-02-16 NOTE — Telephone Encounter (Signed)
Wife called to set up an appt for pt to have lab work done. Call back at [phone]

## 2021-02-19 ENCOUNTER — Other Ambulatory Visit: Payer: Self-pay | Admitting: *Deleted

## 2021-02-19 DIAGNOSIS — D649 Anemia, unspecified: Secondary | ICD-10-CM

## 2021-02-24 ENCOUNTER — Inpatient Hospital Stay: Payer: BC Managed Care – PPO | Attending: Oncology

## 2021-02-24 DIAGNOSIS — R701 Abnormal plasma viscosity: Secondary | ICD-10-CM | POA: Insufficient documentation

## 2021-02-24 DIAGNOSIS — E538 Deficiency of other specified B group vitamins: Secondary | ICD-10-CM | POA: Insufficient documentation

## 2021-02-24 DIAGNOSIS — R17 Unspecified jaundice: Secondary | ICD-10-CM | POA: Insufficient documentation

## 2021-02-24 DIAGNOSIS — D649 Anemia, unspecified: Secondary | ICD-10-CM | POA: Insufficient documentation

## 2021-02-26 ENCOUNTER — Telehealth: Payer: Self-pay | Admitting: Oncology

## 2021-02-26 NOTE — Telephone Encounter (Signed)
Pt called to reschedule his appt for 1-17. Call back at [phone]

## 2021-03-03 ENCOUNTER — Other Ambulatory Visit: Payer: Self-pay

## 2021-03-03 ENCOUNTER — Inpatient Hospital Stay: Payer: BC Managed Care – PPO

## 2021-03-03 DIAGNOSIS — E538 Deficiency of other specified B group vitamins: Secondary | ICD-10-CM | POA: Diagnosis not present

## 2021-03-03 DIAGNOSIS — D649 Anemia, unspecified: Secondary | ICD-10-CM | POA: Diagnosis not present

## 2021-03-03 DIAGNOSIS — R701 Abnormal plasma viscosity: Secondary | ICD-10-CM

## 2021-03-03 DIAGNOSIS — R17 Unspecified jaundice: Secondary | ICD-10-CM

## 2021-03-03 LAB — CBC WITH DIFFERENTIAL/PLATELET
Abs Immature Granulocytes: 0.03 10*3/uL (ref 0.00–0.07)
Basophils Absolute: 0 10*3/uL (ref 0.0–0.1)
Basophils Relative: 1 %
Eosinophils Absolute: 0.2 10*3/uL (ref 0.0–0.5)
Eosinophils Relative: 3 %
HCT: 42.9 % (ref 39.0–52.0)
Hemoglobin: 14.5 g/dL (ref 13.0–17.0)
Immature Granulocytes: 0 %
Lymphocytes Relative: 24 %
Lymphs Abs: 1.8 10*3/uL (ref 0.7–4.0)
MCH: 29.5 pg (ref 26.0–34.0)
MCHC: 33.8 g/dL (ref 30.0–36.0)
MCV: 87.4 fL (ref 80.0–100.0)
Monocytes Absolute: 0.6 10*3/uL (ref 0.1–1.0)
Monocytes Relative: 8 %
Neutro Abs: 4.9 10*3/uL (ref 1.7–7.7)
Neutrophils Relative %: 64 %
Platelets: 231 10*3/uL (ref 150–400)
RBC: 4.91 MIL/uL (ref 4.22–5.81)
RDW: 13.1 % (ref 11.5–15.5)
WBC: 7.6 10*3/uL (ref 4.0–10.5)
nRBC: 0 % (ref 0.0–0.2)

## 2021-03-03 LAB — IRON AND TIBC
Iron: 61 ug/dL (ref 45–182)
Saturation Ratios: 18 % (ref 17.9–39.5)
TIBC: 344 ug/dL (ref 250–450)
UIBC: 283 ug/dL

## 2021-03-03 LAB — FERRITIN: Ferritin: 88 ng/mL (ref 24–336)

## 2021-03-03 LAB — FOLATE: Folate: 8.4 ng/mL (ref >5.9)

## 2021-03-03 LAB — VITAMIN B12: Vitamin B-12: 167 pg/mL — ABNORMAL LOW (ref 180–914)

## 2021-03-04 LAB — PATHOLOGIST SMEAR REVIEW

## 2021-03-06 LAB — HGB FRACTIONATION CASCADE
Hgb A2: 2.4 % (ref 1.8–3.2)
Hgb A: 97.6 % (ref 96.4–98.8)
Hgb F: 0 % (ref 0.0–2.0)
Hgb S: 0 %

## 2021-03-10 ENCOUNTER — Telehealth: Payer: Self-pay

## 2021-03-10 ENCOUNTER — Encounter: Payer: Self-pay | Admitting: Oncology

## 2021-03-10 NOTE — Progress Notes (Signed)
Phone note created 

## 2021-03-10 NOTE — Telephone Encounter (Signed)
-----   Message from Earlie Server, MD sent at 03/09/2021 11:39 PM EST ----- Please let him know that his lab work up results are fine except low B12 level.  Recommend him to take oral b12 1082mcg daily - OTC supply.  Follow up 4 months, lab cbc B12 prior to MD +/- B12 injection. Thanks.

## 2021-03-10 NOTE — Telephone Encounter (Signed)
Sent patient MyChart to inform.

## 2021-07-03 ENCOUNTER — Telehealth: Payer: Self-pay | Admitting: Oncology

## 2021-07-03 NOTE — Telephone Encounter (Signed)
Pt wife called in stating that she does not know what appts are for. Would like to be called with reason.Marland KitchenKJ

## 2021-07-03 NOTE — Telephone Encounter (Signed)
Wife called back, She stated that she remembered what the appt was for. She stated that they are on [employer] and cannot afford  anything due to the coverage. She stated she wanted to cancel the appts. I offered to reschedule to a later date, she refused and she would call back if they needed an appt.

## 2021-07-07 ENCOUNTER — Inpatient Hospital Stay: Payer: Self-pay

## 2021-07-08 ENCOUNTER — Inpatient Hospital Stay: Payer: Self-pay | Admitting: Oncology

## 2021-07-08 ENCOUNTER — Inpatient Hospital Stay: Payer: Self-pay

## 2021-10-20 ENCOUNTER — Ambulatory Visit: Payer: BC Managed Care – PPO | Admitting: Family Medicine

## 2022-02-02 ENCOUNTER — Ambulatory Visit: Payer: BC Managed Care – PPO | Admitting: Family Medicine

## 2022-02-05 ENCOUNTER — Ambulatory Visit (INDEPENDENT_AMBULATORY_CARE_PROVIDER_SITE_OTHER): Payer: 59 | Admitting: Nurse Practitioner

## 2022-02-05 ENCOUNTER — Encounter: Payer: Self-pay | Admitting: Nurse Practitioner

## 2022-02-05 VITALS — BP 130/86 | HR 80 | Temp 98.3°F | Ht 71.0 in | Wt 267.8 lb

## 2022-02-05 DIAGNOSIS — E538 Deficiency of other specified B group vitamins: Secondary | ICD-10-CM

## 2022-02-05 DIAGNOSIS — Z0000 Encounter for general adult medical examination without abnormal findings: Secondary | ICD-10-CM

## 2022-02-05 DIAGNOSIS — E669 Obesity, unspecified: Secondary | ICD-10-CM | POA: Diagnosis not present

## 2022-02-05 DIAGNOSIS — Z13220 Encounter for screening for lipoid disorders: Secondary | ICD-10-CM

## 2022-02-05 LAB — CBC WITH DIFFERENTIAL/PLATELET
Basophils Absolute: 0 10*3/uL (ref 0.0–0.1)
Basophils Relative: 0.6 % (ref 0.0–3.0)
Eosinophils Absolute: 0.2 10*3/uL (ref 0.0–0.7)
Eosinophils Relative: 3.2 % (ref 0.0–5.0)
HCT: 43.9 % (ref 39.0–52.0)
Hemoglobin: 14.8 g/dL (ref 13.0–17.0)
Lymphocytes Relative: 24.6 % (ref 12.0–46.0)
Lymphs Abs: 1.7 10*3/uL (ref 0.7–4.0)
MCHC: 33.7 g/dL (ref 30.0–36.0)
MCV: 87.9 fl (ref 78.0–100.0)
Monocytes Absolute: 0.6 10*3/uL (ref 0.1–1.0)
Monocytes Relative: 8.8 % (ref 3.0–12.0)
Neutro Abs: 4.3 10*3/uL (ref 1.4–7.7)
Neutrophils Relative %: 62.8 % (ref 43.0–77.0)
Platelets: 217 10*3/uL (ref 150.0–400.0)
RBC: 4.99 Mil/uL (ref 4.22–5.81)
RDW: 13.8 % (ref 11.5–15.5)
WBC: 6.9 10*3/uL (ref 4.0–10.5)

## 2022-02-05 LAB — COMPREHENSIVE METABOLIC PANEL
ALT: 80 U/L — ABNORMAL HIGH (ref 0–53)
AST: 38 U/L — ABNORMAL HIGH (ref 0–37)
Albumin: 4.7 g/dL (ref 3.5–5.2)
Alkaline Phosphatase: 62 U/L (ref 39–117)
BUN: 10 mg/dL (ref 6–23)
CO2: 26 mEq/L (ref 19–32)
Calcium: 9.6 mg/dL (ref 8.4–10.5)
Chloride: 102 mEq/L (ref 96–112)
Creatinine, Ser: 0.82 mg/dL (ref 0.40–1.50)
GFR: 108.62 mL/min (ref >60.00)
Glucose, Bld: 91 mg/dL (ref 70–99)
Potassium: 4 mEq/L (ref 3.5–5.1)
Sodium: 137 mEq/L (ref 135–145)
Total Bilirubin: 1 mg/dL (ref 0.2–1.2)
Total Protein: 7.2 g/dL (ref 6.0–8.3)

## 2022-02-05 LAB — LIPID PANEL
Cholesterol: 215 mg/dL — ABNORMAL HIGH (ref 0–200)
HDL: 49.7 mg/dL (ref >39.00)
LDL Cholesterol: 131 mg/dL — ABNORMAL HIGH (ref 0–99)
NonHDL: 165.28
Total CHOL/HDL Ratio: 4
Triglycerides: 173 mg/dL — ABNORMAL HIGH (ref 0.0–149.0)
VLDL: 34.6 mg/dL (ref 0.0–40.0)

## 2022-02-05 LAB — TSH: TSH: 1.35 u[IU]/mL (ref 0.35–5.50)

## 2022-02-05 LAB — HEMOGLOBIN A1C: Hgb A1c MFr Bld: 5.5 % (ref 4.6–6.5)

## 2022-02-05 LAB — VITAMIN B12: Vitamin B-12: 489 pg/mL (ref 211–911)

## 2022-02-05 NOTE — Progress Notes (Signed)
Bethanie Dicker, NP-C Phone: [phone]  Billy Willis is a 41 y.o. male who presents today for complete physical exam.   He has no complaints or new concerns at this time.  Diet: Reports recently starting [employer], a weight loss program/app that helps with dieting. Exercise: Not exercising currently but reports he intends to begin exercising more Colonoscopy: 03/04/2020, 10 year recall Prostate cancer screening: Not indicated Family history-  Prostate cancer: No  Colon cancer: No Vaccines-   Flu: Declined  Tetanus: UTD  COVID19: x2, declines booster HIV screening: UTD Hep C Screening: UTD Tobacco use: No Alcohol use: Occasionally a beer every other week Illicit Drug use: No Dentist: Yes, UTD Ophthalmology: Yes. TUD   Social History   Tobacco Use  Smoking Status Never  Smokeless Tobacco Never    Current Outpatient Medications on File Prior to Visit  Medication Sig Dispense Refill   clomiPHENE (CLOMID) 50 MG tablet Take 50 mg by mouth every other day. Taken 1/2 tablet once per day     No current facility-administered medications on file prior to visit.     Review of Systems  Constitutional:  Negative for fever, malaise/fatigue and weight loss.  HENT:  Negative for ear pain, hearing loss, sore throat and tinnitus.   Eyes:  Negative for blurred vision and pain.  Respiratory:  Negative for cough, shortness of breath and wheezing.   Cardiovascular:  Negative for chest pain, palpitations and leg swelling.  Gastrointestinal:  Negative for abdominal pain, blood in stool, constipation, diarrhea, heartburn, nausea and vomiting.  Genitourinary:  Negative for dysuria, frequency and urgency.  Musculoskeletal:  Negative for myalgias.  Skin:  Negative for itching and rash.  Neurological:  Negative for dizziness, sensory change, speech change, seizures, loss of consciousness, weakness and headaches.  Endo/Heme/Allergies:  Does not bruise/bleed easily.  Psychiatric/Behavioral:   Negative for depression and memory loss. The patient is not nervous/anxious and does not have insomnia.     Objective  Physical Exam Vitals:   02/05/22 1046  BP: 130/86  Pulse: 80  Temp: 98.3 F (36.8 C)  SpO2: 97%    BP Readings from Last 3 Encounters:  02/05/22 130/86  01/27/21 (!) 145/80  10/07/20 120/80   Wt Readings from Last 3 Encounters:  02/05/22 267 lb 12.8 oz (121.5 kg)  01/27/21 257 lb (116.6 kg)  10/07/20 250 lb 8 oz (113.6 kg)    Physical Exam Constitutional:      General: He is not in acute distress.    Appearance: Normal appearance. He is obese. He is not diaphoretic.  HENT:     Head: Normocephalic.     Right Ear: Tympanic membrane, ear canal and external ear normal.     Left Ear: Tympanic membrane, ear canal and external ear normal.     Nose: Nose normal.     Mouth/Throat:     Mouth: Mucous membranes are moist.     Pharynx: Oropharynx is clear.  Eyes:     General: No scleral icterus.    Conjunctiva/sclera: Conjunctivae normal.     Pupils: Pupils are equal, round, and reactive to light.  Neck:     Thyroid: No thyromegaly.  Cardiovascular:     Rate and Rhythm: Normal rate and regular rhythm.     Heart sounds: Normal heart sounds.  Pulmonary:     Effort: Pulmonary effort is normal.     Breath sounds: Normal breath sounds. No wheezing.  Abdominal:     General: Abdomen is flat. Bowel sounds are  normal. There is no distension.     Palpations: Abdomen is soft. There is no mass.     Tenderness: There is no abdominal tenderness.  Musculoskeletal:        General: No swelling or tenderness. Normal range of motion.     Right lower leg: No edema.     Left lower leg: No edema.  Lymphadenopathy:     Cervical: No cervical adenopathy.  Skin:    General: Skin is warm and dry.     Coloration: Skin is not jaundiced.  Neurological:     Mental Status: He is alert.  Psychiatric:        Mood and Affect: Mood normal.        Behavior: Behavior normal.         Thought Content: Thought content normal.     Assessment/Plan: Please see individual problem list.  Encounter for annual physical exam Assessment & Plan: Physical exam complete. Encouraged healthy diet and exercise. Patient declined flu vaccine today. Lab work as outlined, will contact patient with results.   Orders: -     CBC with Differential/Platelet -     Comprehensive metabolic panel -     TSH  Gilbert syndrome Assessment & Plan: Patient is no longer seeing Heme/Onc. Will monitor labs.   Orders: -     CBC with Differential/Platelet -     Comprehensive metabolic panel  Lipid screening -     Lipid panel  Vitamin B12 deficiency Assessment & Plan: Taking OTC B12 daily. Continue. Will check B12 level today.   Orders: -     Vitamin B12  Obesity (BMI 30-39.9) -     Hemoglobin A1c    Return in about 1 year (around 02/06/2023) for Annual exam, sooner PRN.   Bethanie Dicker, NP-C [facility] [employer]

## 2022-02-05 NOTE — Assessment & Plan Note (Signed)
Physical exam complete. Encouraged healthy diet and exercise. Patient declined flu vaccine today. Lab work as outlined, will contact patient with results.

## 2022-02-05 NOTE — Assessment & Plan Note (Addendum)
Taking OTC B12 daily. Continue. Will check B12 level today.

## 2022-02-05 NOTE — Assessment & Plan Note (Signed)
Patient is no longer seeing Heme/Onc. Will monitor labs.

## 2022-02-09 ENCOUNTER — Telehealth: Payer: Self-pay

## 2022-02-09 NOTE — Telephone Encounter (Signed)
LMOM for pt to CB in regards to labs 

## 2022-02-11 ENCOUNTER — Telehealth: Payer: Self-pay

## 2022-02-11 NOTE — Telephone Encounter (Signed)
I called and spoke with the patient and informed him that he does not meet the criteria for cholesterol medicaiton, he asked me to sedn information about the cholesterol and diet and I sent it in his mychart today.  Demari Gales,cma

## 2022-02-11 NOTE — Telephone Encounter (Signed)
Wife called back and stated that they would like to start a statin after doing research and you can send to [address].  Shaul Trautman,cma

## 2022-02-11 NOTE — Telephone Encounter (Signed)
On review of the labs he does not meet criteria for a statin based on his ASCVD risk score being less than 5%. He does need to work on diet and exercise and we could send him diet information if desired. Forwarding to the ordering provider so she is aware of this as well.   The 10-year ASCVD risk score (Arnett DK, et al., 2019) is: 1.7%   Values used to calculate the score:     Age: 42 years     Sex: Male     Is Non-Hispanic African American: No     Diabetic: No     Tobacco smoker: No     Systolic Blood Pressure: 264 mmHg     Is BP treated: No     HDL Cholesterol: 49.7 mg/dL     Total Cholesterol: 215 mg/dL

## 2022-11-17 ENCOUNTER — Encounter: Payer: 59 | Admitting: Family Medicine

## 2022-11-29 ENCOUNTER — Ambulatory Visit (INDEPENDENT_AMBULATORY_CARE_PROVIDER_SITE_OTHER): Payer: BC Managed Care – PPO | Admitting: Family Medicine

## 2022-11-29 ENCOUNTER — Encounter: Payer: Self-pay | Admitting: Family Medicine

## 2022-11-29 VITALS — BP 118/82 | HR 89 | Temp 98.3°F | Ht 71.0 in | Wt 247.0 lb

## 2022-11-29 DIAGNOSIS — M791 Myalgia, unspecified site: Secondary | ICD-10-CM | POA: Diagnosis not present

## 2022-11-29 DIAGNOSIS — E669 Obesity, unspecified: Secondary | ICD-10-CM | POA: Diagnosis not present

## 2022-11-29 DIAGNOSIS — E538 Deficiency of other specified B group vitamins: Secondary | ICD-10-CM

## 2022-11-29 DIAGNOSIS — M549 Dorsalgia, unspecified: Secondary | ICD-10-CM | POA: Insufficient documentation

## 2022-11-29 DIAGNOSIS — Z0000 Encounter for general adult medical examination without abnormal findings: Secondary | ICD-10-CM

## 2022-11-29 DIAGNOSIS — Z13220 Encounter for screening for lipoid disorders: Secondary | ICD-10-CM

## 2022-11-29 DIAGNOSIS — M545 Low back pain, unspecified: Secondary | ICD-10-CM

## 2022-11-29 DIAGNOSIS — E291 Testicular hypofunction: Secondary | ICD-10-CM

## 2022-11-29 DIAGNOSIS — Z0001 Encounter for general adult medical examination with abnormal findings: Secondary | ICD-10-CM

## 2022-11-29 NOTE — Assessment & Plan Note (Signed)
Physical exam completed.  Encouraged healthy diet and exercise.  Patient declines flu vaccine and further COVID vaccinations.  Lab work as outlined.

## 2022-11-29 NOTE — Assessment & Plan Note (Signed)
Likely related to how he sleeping.  Discussed potential for physical therapy versus home exercises.  Patient opted for home exercises.  These are in his AVS.

## 2022-11-29 NOTE — Progress Notes (Signed)
Marikay Alar, MD Phone: [phone]  Billy Willis is a 42 y.o. male who presents today for CPE.  Diet: Patient has worked on reducing sugar intake.  No soda.  He does have ice cream and a small portion each day, has reduced chips Exercise: Stays active though no specific exercise Family history-  Prostate cancer: no  Colon cancer: no Vaccines-   Flu: declines  Tetanus: UTD  COVID19: x2 HIV screening: UTD Hep C Screening: UTD Tobacco use: no Alcohol use: no Illicit Drug use: no Dentist: yes Ophthalmology: yes  Bilateral arm soreness: Patient notes he had a prolonged issue with a sensation of arm weakness and soreness in his elbows bilaterally.  Notes this lasted 4 to 5 months.  Notes it has improved as he has lost weight.  Notes it started after he was exercising 2-3 times a week though it seems to persist after he stopped exercising.  Has not noticed it very much over the last month.  Back pain: Patient notes some chronic issues with his back bothering him if he sleeps wrong.  He is thinking about seeing a chiropractor for this.   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   Obesity (BMI 30-39.9) 02/05/2022   Muscle soreness 11/29/2022   Back ache 11/29/2022   Resolved Ambulatory Problems    Diagnosis Date Noted   Vasovagal syncope 05/07/2015   Lump in the testicle 05/07/2015   Left flank pain 07/16/2015   Past Medical History:  Diagnosis Date   Elevated bilirubin    Fatty liver disease, nonalcoholic    History of chicken pox    Kidney stones  02/08/2013   Reticulocytosis     Family History  Problem Relation Age of Onset   Cancer Mother        ovary/uterine cancer   Mental illness Father    Early death Father    Heart attack Father 23   Arthritis Maternal Grandmother    Arthritis Maternal Grandfather    Stroke Paternal Grandmother    Stroke Paternal Grandfather     Social History   Socioeconomic History   Marital status: Married    Spouse name: Not on file   Number of children: Not on file   Years of education: Not on file   Highest education level: Bachelor's degree (e.g., BA, AB, BS)  Occupational History   Not on file  Tobacco Use   Smoking status: Never   Smokeless tobacco: Never  Vaping Use   Vaping status: Never Used  Substance and Sexual Activity   Alcohol use: Yes    Alcohol/week: 1.0 standard drink of alcohol    Types: 1 Cans of beer per week    Comment: holiday liquor once   Drug use: No   Sexual activity: Yes    Comment: not for a long time, had unprotected sex 15 years ago  Other Topics Concern   Not on file  Social History Narrative   Not on file   Social Determinants of Health   Financial Resource Strain: Low Risk  (11/27/2022)   Overall Financial Resource  Strain (CARDIA)    Difficulty of Paying Living Expenses: Not hard at all  Food Insecurity: No Food Insecurity (11/27/2022)   Hunger Vital Sign    Worried About Running Out of Food in the Last Year: Never true    Ran Out of Food in the Last Year: Never true  Transportation Needs: No Transportation Needs (11/27/2022)   PRAPARE - Administrator, Civil Service (Medical): No    Lack of Transportation (Non-Medical): No  Physical Activity: Unknown (11/27/2022)   Exercise Vital Sign    Days of Exercise per Week: 0 days    Minutes of Exercise per Session: Not on file  Stress: Stress Concern Present (11/27/2022)   [employer] of Occupational Health - Occupational Stress Questionnaire    Feeling of Stress : To some  extent  Social Connections: Socially Integrated (11/27/2022)   Social Connection and Isolation Panel [NHANES]    Frequency of Communication with Friends and Family: More than three times a week    Frequency of Social Gatherings with Friends and Family: Once a week    Attends Religious Services: More than 4 times per year    Active Member of [employer] or Organizations: Yes    Attends Banker Meetings: Patient declined    Marital Status: Married  Catering manager Violence: Not on file    ROS  General:  Negative for nexplained weight loss, fever Skin: Negative for new or changing mole, sore that won't heal HEENT: Negative for trouble hearing, trouble seeing, ringing in ears, mouth sores, hoarseness, change in voice, dysphagia. CV:  Negative for chest pain, dyspnea, edema, palpitations Resp: Negative for cough, dyspnea, hemoptysis GI: Negative for nausea, vomiting, diarrhea, constipation, abdominal pain, melena, hematochezia. GU: Negative for dysuria, incontinence, urinary hesitance, hematuria, vaginal or penile discharge, polyuria, sexual difficulty, lumps in testicle or breasts MSK: Negative for muscle cramps or aches, joint pain or swelling Neuro: Negative for headaches, weakness, numbness, dizziness, passing out/fainting Psych: Negative for depression, anxiety, memory problems  Objective  Physical Exam Vitals:   11/29/22 1508  BP: 118/82  Pulse: 89  Temp: 98.3 F (36.8 C)  SpO2: 97%    BP Readings from Last 3 Encounters:  11/29/22 118/82  02/05/22 130/86  01/27/21 (!) 145/80   Wt Readings from Last 3 Encounters:  11/29/22 247 lb (112 kg)  02/05/22 267 lb 12.8 oz (121.5 kg)  01/27/21 257 lb (116.6 kg)    Physical Exam Constitutional:      General: He is not in acute distress.    Appearance: He is not diaphoretic.  HENT:     Head: Normocephalic and atraumatic.  Cardiovascular:     Rate and Rhythm: Normal rate and regular rhythm.     Heart sounds: Normal  heart sounds.  Pulmonary:     Effort: Pulmonary effort is normal.     Breath sounds: Normal breath sounds.  Abdominal:     General: Bowel sounds are normal. There is no distension.     Palpations: Abdomen is soft.     Tenderness: There is no abdominal tenderness.  Musculoskeletal:     Right lower leg: No edema.     Left lower leg: No edema.     Comments: No midline spine tenderness, no midline spine step-off, no muscular back tenderness  Lymphadenopathy:     Cervical: No cervical adenopathy.  Skin:    General: Skin is warm and dry.  Neurological:     Mental Status: He is alert.  Comments: 5/5 strength in bilateral biceps, triceps, grip, quads, hamstrings, plantar and dorsiflexion, sensation to light touch intact in bilateral UE and LE, normal gait  Psychiatric:        Mood and Affect: Mood normal.      Assessment/Plan:   Encounter for general adult medical examination with abnormal findings Assessment & Plan: Physical exam completed.  Encouraged healthy diet and exercise.  Patient declines flu vaccine and further COVID vaccinations.  Lab work as outlined.   Muscle soreness Assessment & Plan: Patient with arm soreness and a sensation of weakness.  Neurologically intact today.  This has improved significantly with loss of weight.  Patient will monitor.  If it worsens at all or becomes more persistent he will let me know.  Orders: -     Comprehensive metabolic panel; Future -     TSH; Future -     VITAMIN D 25 Hydroxy (Vit-D Deficiency, Fractures); Future  Low back pain without sciatica, unspecified back pain laterality, unspecified chronicity Assessment & Plan: Likely related to how he sleeping.  Discussed potential for physical therapy versus home exercises.  Patient opted for home exercises.  These are in his AVS.   Vitamin B12 deficiency -     Vitamin B12; Future  Obesity (BMI 30-39.9) -     Hemoglobin A1c; Future  Hypogonadism in male -     Testosterone;  Future  Lipid screening -     Lipid panel; Future    Return in about 1 week (around 12/06/2022) for Labs, 1 year CPE with new provider.   Marikay Alar, MD [facility]

## 2022-11-29 NOTE — Assessment & Plan Note (Signed)
Patient with arm soreness and a sensation of weakness.  Neurologically intact today.  This has improved significantly with loss of weight.  Patient will monitor.  If it worsens at all or becomes more persistent he will let me know.

## 2022-11-29 NOTE — Patient Instructions (Signed)
Nice to see you. Please do the following exercises for your back to see if this is beneficial.  Back Exercises The following exercises strengthen the muscles that help to support the trunk (torso) and back. They also help to keep the lower back flexible. Doing these exercises can help to prevent or lessen existing low back pain. If you have back pain or discomfort, try doing these exercises 2-3 times each day or as told by your health care provider. As your pain improves, do them once each day, but increase the number of times that you repeat the steps for each exercise (do more repetitions). To prevent the recurrence of back pain, continue to do these exercises once each day or as told by your health care provider. Do exercises exactly as told by your health care provider and adjust them as directed. It is normal to feel mild stretching, pulling, tightness, or discomfort as you do these exercises, but you should stop right away if you feel sudden pain or your pain gets worse. Exercises Single knee to chest Repeat these steps 3-5 times for each leg: Lie on your back on a firm bed or the floor with your legs extended. Bring one knee to your chest. Your other leg should stay extended and in contact with the floor. Hold your knee in place by grabbing your knee or thigh with both hands and hold. Pull on your knee until you feel a gentle stretch in your lower back or buttocks. Hold the stretch for 10-30 seconds. Slowly release and straighten your leg.  Pelvic tilt Repeat these steps 5-10 times: Lie on your back on a firm bed or the floor with your legs extended. Bend your knees so they are pointing toward the ceiling and your feet are flat on the floor. Tighten your lower abdominal muscles to press your lower back against the floor. This motion will tilt your pelvis so your tailbone points up toward the ceiling instead of pointing to your feet or the floor. With gentle tension and even breathing,  hold this position for 5-10 seconds.  Cat-cow Repeat these steps until your lower back becomes more flexible: Get into a hands-and-knees position on a firm bed or the floor. Keep your hands under your shoulders, and keep your knees under your hips. You may place padding under your knees for comfort. Let your head hang down toward your chest. Contract your abdominal muscles and point your tailbone toward the floor so your lower back becomes rounded like the back of a cat. Hold this position for 5 seconds. Slowly lift your head, let your abdominal muscles relax, and point your tailbone up toward the ceiling so your back forms a sagging arch like the back of a cow. Hold this position for 5 seconds.  Press-ups Repeat these steps 5-10 times: Lie on your abdomen (face-down) on a firm bed or the floor. Place your palms near your head, about shoulder-width apart. Keeping your back as relaxed as possible and keeping your hips on the floor, slowly straighten your arms to raise the top half of your body and lift your shoulders. Do not use your back muscles to raise your upper torso. You may adjust the placement of your hands to make yourself more comfortable. Hold this position for 5 seconds while you keep your back relaxed. Slowly return to lying flat on the floor.  Bridges Repeat these steps 10 times: Lie on your back on a firm bed or the floor. Bend your knees so  they are pointing toward the ceiling and your feet are flat on the floor. Your arms should be flat at your sides, next to your body. Tighten your buttocks muscles and lift your buttocks off the floor until your waist is at almost the same height as your knees. You should feel the muscles working in your buttocks and the back of your thighs. If you do not feel these muscles, slide your feet 1-2 inches (2.5-5 cm) farther away from your buttocks. Hold this position for 3-5 seconds. Slowly lower your hips to the starting position, and allow your  buttocks muscles to relax completely. If this exercise is too easy, try doing it with your arms crossed over your chest.  Back lifts Repeat these steps 5-10 times: Lie on your abdomen (face-down) with your arms at your sides, and rest your forehead on the floor. Tighten the muscles in your legs and your buttocks. Slowly lift your chest off the floor while you keep your hips pressed to the floor. Keep the back of your head in line with the curve in your back. Your eyes should be looking at the floor. Hold this position for 3-5 seconds. Slowly return to your starting position.  Contact a health care provider if: Your back pain or discomfort gets much worse when you do an exercise. Your worsening back pain or discomfort does not lessen within 2 hours after you exercise. If you have any of these problems, stop doing these exercises right away. Do not do them again unless your health care provider says that you can. Get help right away if: You develop sudden, severe back pain. If this happens, stop doing the exercises right away. Do not do them again unless your health care provider says that you can. This information is not intended to replace advice given to you by your health care provider. Make sure you discuss any questions you have with your health care provider. Document Revised: 02/28/2022 Document Reviewed: 04/09/2020 Elsevier Patient Education  2024 [employer].

## 2022-12-03 ENCOUNTER — Telehealth: Payer: Self-pay | Admitting: Family Medicine

## 2022-12-03 NOTE — Telephone Encounter (Signed)
Patient's wife just called and wants to know if her husband can go to [facility] to get blood work done because of his work schedule. His number is [phone].

## 2022-12-05 ENCOUNTER — Encounter: Payer: Self-pay | Admitting: Family Medicine

## 2022-12-07 NOTE — Telephone Encounter (Signed)
Dr. Birdie Sons already changed them.

## 2022-12-07 NOTE — Addendum Note (Signed)
Addended by: Birdie Sons, Cadon Raczka G on: 12/07/2022 12:55 PM   Modules accepted: Orders

## 2022-12-07 NOTE — Telephone Encounter (Signed)
Patient's wife just called and wants to get lab orders for her husband to go to [facility] so he can get labs done. They sent a MyChart message las Friday. His number is [phone].

## 2022-12-08 NOTE — Telephone Encounter (Signed)
Patient's wife came into office to pick up lab orders. Orders were printed out and given to patient's wife.

## 2022-12-08 NOTE — Telephone Encounter (Signed)
noted 

## 2022-12-09 ENCOUNTER — Other Ambulatory Visit: Payer: BC Managed Care – PPO

## 2022-12-10 LAB — HEMOGLOBIN A1C
Est. average glucose Bld gHb Est-mCnc: 108 mg/dL
Hgb A1c MFr Bld: 5.4 % (ref 4.8–5.6)

## 2022-12-10 LAB — LIPID PANEL
Chol/HDL Ratio: 4.1 ratio (ref 0.0–5.0)
Cholesterol, Total: 173 mg/dL (ref 100–199)
HDL: 42 mg/dL (ref >39)
LDL Chol Calc (NIH): 107 mg/dL — ABNORMAL HIGH (ref 0–99)
Triglycerides: 132 mg/dL (ref 0–149)
VLDL Cholesterol Cal: 24 mg/dL (ref 5–40)

## 2022-12-10 LAB — COMPREHENSIVE METABOLIC PANEL
ALT: 32 [IU]/L (ref 0–44)
AST: 24 [IU]/L (ref 0–40)
Albumin: 4.4 g/dL (ref 4.1–5.1)
Alkaline Phosphatase: 84 [IU]/L (ref 44–121)
BUN/Creatinine Ratio: 11 (ref 9–20)
BUN: 9 mg/dL (ref 6–24)
Bilirubin Total: 1.1 mg/dL (ref 0.0–1.2)
CO2: 20 mmol/L (ref 20–29)
Calcium: 9.3 mg/dL (ref 8.7–10.2)
Chloride: 100 mmol/L (ref 96–106)
Creatinine, Ser: 0.85 mg/dL (ref 0.76–1.27)
Globulin, Total: 2.4 g/dL (ref 1.5–4.5)
Glucose: 91 mg/dL (ref 70–99)
Potassium: 4.3 mmol/L (ref 3.5–5.2)
Sodium: 137 mmol/L (ref 134–144)
Total Protein: 6.8 g/dL (ref 6.0–8.5)
eGFR: 111 mL/min/{1.73_m2} (ref >59)

## 2022-12-10 LAB — TSH: TSH: 1.37 u[IU]/mL (ref 0.450–4.500)

## 2022-12-10 LAB — TESTOSTERONE: Testosterone: 295 ng/dL (ref 264–916)

## 2022-12-10 LAB — VITAMIN B12: Vitamin B-12: 788 pg/mL (ref 232–1245)

## 2022-12-10 LAB — VITAMIN D 25 HYDROXY (VIT D DEFICIENCY, FRACTURES): Vit D, 25-Hydroxy: 34.3 ng/mL (ref 30.0–100.0)

## 2023-04-25 ENCOUNTER — Telehealth: Payer: Self-pay | Admitting: Family Medicine

## 2023-04-25 NOTE — Telephone Encounter (Signed)
 Dr Birdie Sons is no longer at this location. Please call the office to schedule a Transfer of Care to either Dr Charlann Lange, Darleen Crocker or Kara Dies, NP. Purvis Sheffield, please schedule a TOC visit for this patient. [facility]   Thank you

## 2023-10-25 ENCOUNTER — Ambulatory Visit (INDEPENDENT_AMBULATORY_CARE_PROVIDER_SITE_OTHER)

## 2023-10-25 ENCOUNTER — Ambulatory Visit (HOSPITAL_BASED_OUTPATIENT_CLINIC_OR_DEPARTMENT_OTHER): Admitting: Student

## 2023-10-25 ENCOUNTER — Other Ambulatory Visit (HOSPITAL_BASED_OUTPATIENT_CLINIC_OR_DEPARTMENT_OTHER): Payer: Self-pay

## 2023-10-25 DIAGNOSIS — M79671 Pain in right foot: Secondary | ICD-10-CM

## 2023-10-25 MED ORDER — MELOXICAM 15 MG PO TABS
15.0000 mg | ORAL_TABLET | Freq: Every day | ORAL | 0 refills | Status: AC
Start: 2023-10-25 — End: 2023-11-08
  Filled 2023-10-25: qty 14, 14d supply, fill #0

## 2023-10-25 NOTE — Progress Notes (Signed)
 Chief Complaint: Right heel pain    Discussed the use of AI scribe software for clinical note transcription with the patient, who gave verbal consent to proceed.  History of Present Illness Billy Willis is a 43 year old male who presents with right heel pain.  He experiences right heel pain that began a few days ago, specifically on Friday. The pain is centralized in the middle of the heel, sore to touch, and becomes noticeable and painful upon standing and weight-bearing. It radiates from the side when weight is applied. A similar episode occurred several months ago, managed with over-the-counter pain medication and ginger, resolving on its own. This current episode escalated to the point where he had to crawl to the bathroom last night due to the inability to walk on it. He denies any recent injury or unusual increase in physical activity. He wears boots throughout the day and has not noticed significant bruising or swelling, although there is some redness in the area. Ibuprofen allows him to stand on the foot, but without it, he is unable to bear weight. He does not experience increased pain upon waking and getting out of bed, but the pain is present whenever weight is applied to the heel.   Surgical History:   None  PMH/PSH/Family History/Social History/Meds/Allergies:    Past Medical History:  Diagnosis Date   Elevated bilirubin    Fatty liver disease, nonalcoholic    History of chicken pox    Kidney stones 02/08/2013   Reticulocytosis    Past Surgical History:  Procedure Laterality Date   COLONOSCOPY WITH PROPOFOL  N/A 03/04/2020   Procedure: COLONOSCOPY WITH PROPOFOL ;  Surgeon: Jinny Carmine, MD;  Location: [facility] ENDOSCOPY;  Service: Endoscopy;  Laterality: N/A;  COVID POSITIVE 02/01/2021   WISDOM TOOTH EXTRACTION     Social History   Socioeconomic History   Marital status: Married    Spouse name: Not on file   Number of children: Not on  file   Years of education: Not on file   Highest education level: Bachelor's degree (e.g., BA, AB, BS)  Occupational History   Not on file  Tobacco Use   Smoking status: Never   Smokeless tobacco: Never  Vaping Use   Vaping status: Never Used  Substance and Sexual Activity   Alcohol use: Yes    Alcohol/week: 1.0 standard drink of alcohol    Types: 1 Cans of beer per week    Comment: holiday liquor once   Drug use: No   Sexual activity: Yes    Comment: not for a long time, had unprotected sex 15 years ago  Other Topics Concern   Not on file  Social History Narrative   Not on file   Social Drivers of Health   Financial Resource Strain: Low Risk  (11/27/2022)   Overall Financial Resource Strain (CARDIA)    Difficulty of Paying Living Expenses: Not hard at all  Food Insecurity: No Food Insecurity (11/27/2022)   Hunger Vital Sign    Worried About Running Out of Food in the Last Year: Never true    Ran Out of Food in the Last Year: Never true  Transportation Needs: No Transportation Needs (11/27/2022)   PRAPARE - Administrator, Civil Service (Medical): No    Lack of Transportation (Non-Medical): No  Physical  Activity: Unknown (11/27/2022)   Exercise Vital Sign    Days of Exercise per Week: 0 days    Minutes of Exercise per Session: Not on file  Stress: Stress Concern Present (11/27/2022)   [employer] of Occupational Health - Occupational Stress Questionnaire    Feeling of Stress : To some extent  Social Connections: Socially Integrated (11/27/2022)   Social Connection and Isolation Panel    Frequency of Communication with Friends and Family: More than three times a week    Frequency of Social Gatherings with Friends and Family: Once a week    Attends Religious Services: More than 4 times per year    Active Member of [employer] or Organizations: Yes    Attends Banker Meetings: Patient declined    Marital Status: Married   Family History   Problem Relation Age of Onset   Cancer Mother        ovary/uterine cancer   Mental illness Father    Early death Father    Heart attack Father 59   Arthritis Maternal Grandmother    Arthritis Maternal Grandfather    Stroke Paternal Grandmother    Stroke Paternal Grandfather    No Known Allergies Current Outpatient Medications  Medication Sig Dispense Refill   meloxicam  (MOBIC ) 15 MG tablet Take 1 tablet (15 mg total) by mouth daily for 14 days. 14 tablet 0   No current facility-administered medications for this visit.   No results found.  Review of Systems:   A ROS was performed including pertinent positives and negatives as documented in the HPI.  Physical Exam :   Constitutional: NAD and appears stated age Neurological: Alert and oriented Psych: Appropriate affect and cooperative There were no vitals taken for this visit.   Comprehensive Musculoskeletal Exam:    Inspection of the right foot demonstrates no obvious deformity, erythema, or ecchymosis.  Pes planus is present without pronation.  Tenderness along the plantar aspect of the calcaneus without tenderness in the arch or midfoot.  Achilles intact and nontender with negative Thompson test.  No tenderness about the medial or lateral ankle.  DP pulse 2+.  Imaging:   Xray (right foot 3 views): No evidence of acute fracture or dislocation.  Small plantar and dorsal calcaneal osteophytes.  There are isolated first MTP degenerative changes.   I personally reviewed and interpreted the radiographs.      Assessment & Plan Acute right heel pain He experiences acute right heel pain during weight-bearing. X-ray shows small osteophytes without evidence of acute abnormality. The differential diagnosis includes plantar fasciitis as most likely however cannot rule out a calcaneal stress reaction. Conservative treatment is preferred.  Will plan to proceed with course of meloxicam  and recommend weightbearing as tolerated in  supportive footwear.  Instructed on ice massage to the bottom of the foot with a frozen water bottle. Monitor symptoms and report if no improvement in two weeks. Discuss the use of a boot for immobilization if pain persists or worsens. Advise follow-up if symptoms do not improve or worsen.       I personally saw and evaluated the patient, and participated in the management and treatment plan.  Leonce Reveal, PA-C Orthopedics

## 2023-10-30 ENCOUNTER — Emergency Department

## 2023-10-30 ENCOUNTER — Emergency Department: Admission: EM | Admit: 2023-10-30 | Discharge: 2023-10-30 | Disposition: A | Discharge status: Home / Self Care

## 2023-10-30 ENCOUNTER — Other Ambulatory Visit: Payer: Self-pay

## 2023-10-30 DIAGNOSIS — R109 Unspecified abdominal pain: Secondary | ICD-10-CM | POA: Diagnosis present

## 2023-10-30 DIAGNOSIS — R110 Nausea: Secondary | ICD-10-CM

## 2023-10-30 DIAGNOSIS — N202 Calculus of kidney with calculus of ureter: Secondary | ICD-10-CM | POA: Diagnosis not present

## 2023-10-30 DIAGNOSIS — N200 Calculus of kidney: Secondary | ICD-10-CM

## 2023-10-30 LAB — URINALYSIS, ROUTINE W REFLEX MICROSCOPIC
Bacteria, UA: NONE SEEN
Bilirubin Urine: NEGATIVE
Glucose, UA: NEGATIVE mg/dL
Ketones, ur: NEGATIVE mg/dL
Leukocytes,Ua: NEGATIVE
Nitrite: NEGATIVE
Protein, ur: NEGATIVE mg/dL
RBC / HPF: >50 RBC/hpf (ref 0–5)
Specific Gravity, Urine: 1.015 (ref 1.005–1.030)
pH: 6 (ref 5.0–8.0)

## 2023-10-30 LAB — CBC
HCT: 43.2 % (ref 39.0–52.0)
Hemoglobin: 14.6 g/dL (ref 13.0–17.0)
MCH: 29.6 pg (ref 26.0–34.0)
MCHC: 33.8 g/dL (ref 30.0–36.0)
MCV: 87.6 fL (ref 80.0–100.0)
Platelets: 256 K/uL (ref 150–400)
RBC: 4.93 MIL/uL (ref 4.22–5.81)
RDW: 13.4 % (ref 11.5–15.5)
WBC: 8.6 K/uL (ref 4.0–10.5)
nRBC: 0 % (ref 0.0–0.2)

## 2023-10-30 LAB — COMPREHENSIVE METABOLIC PANEL WITH GFR
ALT: 60 U/L — ABNORMAL HIGH (ref 0–44)
AST: 42 U/L — ABNORMAL HIGH (ref 15–41)
Albumin: 4.2 g/dL (ref 3.5–5.0)
Alkaline Phosphatase: 66 U/L (ref 38–126)
Anion gap: 12 (ref 5–15)
BUN: 12 mg/dL (ref 6–20)
CO2: 22 mmol/L (ref 22–32)
Calcium: 9.2 mg/dL (ref 8.9–10.3)
Chloride: 103 mmol/L (ref 98–111)
Creatinine, Ser: 0.9 mg/dL (ref 0.61–1.24)
GFR, Estimated: >60 mL/min (ref >60)
Glucose, Bld: 157 mg/dL — ABNORMAL HIGH (ref 70–99)
Potassium: 3.9 mmol/L (ref 3.5–5.1)
Sodium: 137 mmol/L (ref 135–145)
Total Bilirubin: 1.4 mg/dL — ABNORMAL HIGH (ref 0.0–1.2)
Total Protein: 7.1 g/dL (ref 6.5–8.1)

## 2023-10-30 LAB — URINE DRUG SCREEN, QUALITATIVE (ARMC ONLY)
Amphetamines, Ur Screen: NOT DETECTED
Barbiturates, Ur Screen: NOT DETECTED
Benzodiazepine, Ur Scrn: NOT DETECTED
Cannabinoid 50 Ng, Ur ~~LOC~~: NOT DETECTED
Cocaine Metabolite,Ur ~~LOC~~: NOT DETECTED
MDMA (Ecstasy)Ur Screen: NOT DETECTED
Methadone Scn, Ur: NOT DETECTED
Opiate, Ur Screen: NOT DETECTED
Phencyclidine (PCP) Ur S: NOT DETECTED
Tricyclic, Ur Screen: NOT DETECTED

## 2023-10-30 LAB — LIPASE, BLOOD: Lipase: 48 U/L (ref 11–51)

## 2023-10-30 MED ORDER — ONDANSETRON 4 MG PO TBDP: 4.0000 mg | ORAL_TABLET | Freq: Three times a day (TID) | ORAL | 0 refills | Status: AC | PRN

## 2023-10-30 MED ORDER — HYDROMORPHONE HCL 1 MG/ML IJ SOLN
0.5000 mg | Freq: Once | INTRAMUSCULAR | Status: AC
  Administered 2023-10-30: 0.5 mg via INTRAVENOUS
  Filled 2023-10-30: qty 0.5

## 2023-10-30 MED ORDER — PROMETHAZINE HCL 25 MG RE SUPP
25.0000 mg | Freq: Once | RECTAL | Status: AC
Start: 2023-10-30 — End: 2023-10-30
  Administered 2023-10-30: 25 mg via RECTAL
  Filled 2023-10-30: qty 1

## 2023-10-30 MED ORDER — PROMETHAZINE HCL 25 MG RE SUPP: 25.0000 mg | Freq: Four times a day (QID) | RECTAL | 0 refills | Status: DC | PRN

## 2023-10-30 MED ORDER — DROPERIDOL 2.5 MG/ML IJ SOLN
2.5000 mg | Freq: Once | INTRAMUSCULAR | Status: AC
  Administered 2023-10-30: 2.5 mg via INTRAVENOUS
  Filled 2023-10-30: qty 2

## 2023-10-30 MED ORDER — SODIUM CHLORIDE 0.9 % IV BOLUS
1000.0000 mL | Freq: Once | INTRAVENOUS | Status: AC
  Administered 2023-10-30: 1000 mL via INTRAVENOUS

## 2023-10-30 MED ORDER — KETOROLAC TROMETHAMINE 15 MG/ML IJ SOLN
15.0000 mg | Freq: Once | INTRAMUSCULAR | Status: AC
  Administered 2023-10-30: 15 mg via INTRAVENOUS
  Filled 2023-10-30: qty 1

## 2023-10-30 MED ORDER — ACETAMINOPHEN 10 MG/ML IV SOLN
1000.0000 mg | Freq: Once | INTRAVENOUS | Status: AC
  Administered 2023-10-30: 1000 mg via INTRAVENOUS
  Filled 2023-10-30 (×2): qty 100

## 2023-10-30 MED ORDER — TAMSULOSIN HCL 0.4 MG PO CAPS
0.4000 mg | ORAL_CAPSULE | Freq: Once | ORAL | Status: AC
  Administered 2023-10-30: 0.4 mg via ORAL
  Filled 2023-10-30: qty 1

## 2023-10-30 MED ORDER — MORPHINE SULFATE (PF) 4 MG/ML IV SOLN
4.0000 mg | Freq: Once | INTRAVENOUS | Status: AC
  Administered 2023-10-30: 4 mg via INTRAVENOUS
  Filled 2023-10-30: qty 1

## 2023-10-30 MED ORDER — TAMSULOSIN HCL 0.4 MG PO CAPS: 0.4000 mg | ORAL_CAPSULE | Freq: Every day | ORAL | 0 refills | Status: AC

## 2023-10-30 MED ORDER — OXYBUTYNIN CHLORIDE 5 MG PO TABS
5.0000 mg | ORAL_TABLET | Freq: Three times a day (TID) | ORAL | Status: DC
  Filled 2023-10-30: qty 1

## 2023-10-30 MED ORDER — OXYCODONE-ACETAMINOPHEN 5-325 MG PO TABS: 1.0000 | ORAL_TABLET | ORAL | 0 refills | Status: AC | PRN

## 2023-10-30 MED ORDER — OXYCODONE HCL 5 MG PO TABS
10.0000 mg | ORAL_TABLET | Freq: Once | ORAL | Status: AC
  Administered 2023-10-30: 10 mg via ORAL
  Filled 2023-10-30: qty 2

## 2023-10-30 MED ORDER — ONDANSETRON HCL 4 MG/2ML IJ SOLN
4.0000 mg | Freq: Once | INTRAMUSCULAR | Status: AC
  Administered 2023-10-30: 4 mg via INTRAVENOUS
  Filled 2023-10-30: qty 2

## 2023-10-30 MED ORDER — SODIUM CHLORIDE 0.9 % IV BOLUS
500.0000 mL | Freq: Once | INTRAVENOUS | Status: AC
  Administered 2023-10-30: 500 mL via INTRAVENOUS

## 2023-10-30 MED ORDER — MIDAZOLAM HCL 2 MG/2ML IJ SOLN
1.0000 mg | Freq: Once | INTRAMUSCULAR | Status: AC
  Administered 2023-10-30: 1 mg via INTRAVENOUS
  Filled 2023-10-30: qty 2

## 2023-10-30 NOTE — ED Notes (Signed)
 Tech Maria answered patients call bell at this time

## 2023-10-30 NOTE — ED Provider Notes (Signed)
 [facility] Provider Note    Event Date/Time   First MD Initiated Contact with Patient 10/30/23 [phone]     (approximate)   History   Flank Pain (/)   HPI  Billy Willis is a 43 y.o. male with a past medical history of vitamin B12 deficiency, kidney stones, obesity who presents with 4 hours of right flank pain.  Pain is constant, associated with urinary hesitancy.  He states that this feels similar to prior kidney stones.  Reports that he was in his normal state of health prior to this.  Denies any chest pain shortness of breath or scrotal or penile abnormalities.  History is limited as patient is screaming and crying out in pain      Physical Exam   Triage Vital Signs: ED Triage Vitals  Encounter Vitals Group     BP 10/30/23 0745 (!) 140/99     Girls Systolic BP Percentile --      Girls Diastolic BP Percentile --      Boys Systolic BP Percentile --      Boys Diastolic BP Percentile --      Pulse Rate 10/30/23 0745 (!) 51     Resp 10/30/23 0745 18     Temp 10/30/23 0745 97.9 F (36.6 C)     Temp Source 10/30/23 0745 Axillary     SpO2 10/30/23 0745 98 %     Weight --      Height --      Head Circumference --      Peak Flow --      Pain Score 10/30/23 0743 10     Pain Loc --      Pain Education --      Exclude from Growth Chart --     Most recent vital signs: Vitals:   10/30/23 0805 10/30/23 1202  BP:  136/78  Pulse:  60  Resp:  20  Temp:  97.6 F (36.4 C)  SpO2: 99% 98%    Nursing Triage Note reviewed. Vital signs reviewed and patients oxygen saturation is normoxic  General: Patient is well nourished, well developed, awake and alert, appears uncomfortable, crying Head: Normocephalic and atraumatic Eyes: Normal inspection, extraocular muscles intact, no conjunctival pallor Ear, nose, throat: Normal external exam Neck: Normal range of motion Respiratory: Patient is in no respiratory distress, lungs CTAB Cardiovascular: Patient is  not tachycardic, RRR without murmur appreciated GI: Abd SNT with no guarding or rebound  Right CVA tenderness to palpation Back: Normal inspection of the back with good strength and range of motion throughout all ext Extremities: pulses intact with good cap refills, no LE pitting edema or calf tenderness Neuro: The patient is alert and oriented to person, place, and time, appropriately conversive, with 5/5 bilat UE/LE strength, no gross motor or sensory defects noted. Coordination appears to be adequate. Skin: Warm, dry, and intact Psych: normal mood and affect, no SI or HI  ED Results / Procedures / Treatments   Labs (all labs ordered are listed, but only abnormal results are displayed) Labs Reviewed  COMPREHENSIVE METABOLIC PANEL WITH GFR - Abnormal; Notable for the following components:      Result Value   Glucose, Bld 157 (*)    AST 42 (*)    ALT 60 (*)    Total Bilirubin 1.4 (*)    All other components within normal limits  URINALYSIS, ROUTINE W REFLEX MICROSCOPIC - Abnormal; Notable for the following components:   Color, Urine YELLOW (*)  APPearance HAZY (*)    Hgb urine dipstick LARGE (*)    All other components within normal limits  LIPASE, BLOOD  CBC  URINE DRUG SCREEN, QUALITATIVE ([facility] ONLY)     EKG None  RADIOLOGY CT renal stone: 2 mm stone at the UVJ nonobstructive on my independent review and interpretation radiologist agrees Right upper quadrant of the abdomen ultrasound: No evidence of cholecystitis    PROCEDURES:  Critical Care performed: Yes  .Critical Care  Performed by: Nicholaus Rolland BRAVO, MD Authorized by: Nicholaus Rolland BRAVO, MD   Critical care provider statement:    Critical care time (minutes):  35   Critical care time was exclusive of:  Separately billable procedures and treating other patients   Critical care was necessary to treat or prevent imminent or life-threatening deterioration of the following conditions: Intractable nausea and  vomiting requiring multiple reassessments.   Critical care was time spent personally by me on the following activities:  Development of treatment plan with patient or surrogate, discussions with consultants, evaluation of patient's response to treatment, examination of patient, ordering and review of laboratory studies, ordering and review of radiographic studies, ordering and performing treatments and interventions, pulse oximetry, re-evaluation of patient's condition and review of old charts Comments:     Patient presents with intractable pain and nausea and vomiting in the setting of a kidney stone.  Unfortunately he had intractable pain and vomiting requiring many reassessments and many doses of IV medication including morphine , Dilaudid , eventually droperidol  and Versed , and many frequent assessments.  He also required aggressive fluid hydration greater than 30 cc per/kg    MEDICATIONS ORDERED IN ED: Medications  oxybutynin  (DITROPAN ) tablet 5 mg (has no administration in time range)  sodium chloride  0.9 % bolus 1,000 mL (0 mLs Intravenous Stopped 10/30/23 1057)  sodium chloride  0.9 % bolus 500 mL (0 mLs Intravenous Stopped 10/30/23 1057)  HYDROmorphone  (DILAUDID ) injection 0.5 mg (0.5 mg Intravenous Given 10/30/23 0812)  ketorolac  (TORADOL ) 15 MG/ML injection 15 mg (15 mg Intravenous Given 10/30/23 9187)  ondansetron  (ZOFRAN ) injection 4 mg (4 mg Intravenous Given 10/30/23 0811)  acetaminophen  (OFIRMEV ) IV 1,000 mg (0 mg Intravenous Stopped 10/30/23 0856)  tamsulosin  (FLOMAX ) capsule 0.4 mg (0.4 mg Oral Given 10/30/23 0914)  HYDROmorphone  (DILAUDID ) injection 0.5 mg (0.5 mg Intravenous Given 10/30/23 0926)  sodium chloride  0.9 % bolus 500 mL (0 mLs Intravenous Stopped 10/30/23 1441)  oxyCODONE  (Oxy IR/ROXICODONE ) immediate release tablet 10 mg (10 mg Oral Given 10/30/23 1209)  ketorolac  (TORADOL ) 15 MG/ML injection 15 mg (15 mg Intravenous Given 10/30/23 1211)  morphine  (PF) 4 MG/ML injection 4 mg (4  mg Intravenous Given 10/30/23 1210)  promethazine  (PHENERGAN ) suppository 25 mg (25 mg Rectal Given 10/30/23 1237)  droperidol  (INAPSINE ) 2.5 MG/ML injection 2.5 mg (2.5 mg Intravenous Given 10/30/23 1323)  midazolam  (VERSED ) injection 1 mg (1 mg Intravenous Given 10/30/23 1325)     IMPRESSION / MDM / ASSESSMENT AND PLAN / ED COURSE                                Differential diagnosis includes, but is not limited to, nephrolithiasis, UTI, acute renal insufficiency, electrolyte derangement anemia  ED course: Patient presents acutely crying out in pain with history limited.  He tells me that this pain is exactly like his prior kidney stone pain.  IV insulted and multimodal pain control initiated with Dilaudid  Toradol  Ofirmev  along with Zofran  and IV  fluids.  Will obtain blood work and urinalysis.  Will obtain a CT renal.  Disposition is pending diagnostics and reassessment   Clinical Course as of 10/30/23 1534  Sun Oct 30, 2023  0844 Creatinine: 0.90 Not elevated [HD]  0844 AST(!): 42 Slightly elevated LFTs can be secondary to nausea but will reassess [HD]  0924 Nonobstructive 2 mm right ureterovesicular junction stone. [HD]  1011 Patient appears much more comfortable on reassessment.  I counseled him on the CT results.  Awaiting right upper quadrant ultrasound but patient does have a history of fatty liver [HD]  1021 Urinalysis, Routine w reflex microscopic -Urine, Clean Catch(!) No evidence of infection [HD]  1159 Patient states that he remains highly uncomfortable and nauseous.  Will order him some more therapeutics [HD]  1244 US  Abdomen Limited RUQ (LIVER/GB) No acute abnormality [HD]  1310 Patient reassessed still having nausea but did just consume a large amount of p.o. [HD]  1318 Will give droperidrol and versed .  [HD]  1412 Patient resting comfortably/sleeping.  Will discharge afterwards [HD]  1439 Patient feels improved will work on discharge paperwork.  Has been able to tolerate  p.o. [HD]    Clinical Course User Index [HD] Nicholaus Rolland BRAVO, MD   At time of discharge there is no evidence of acute life, limb, vision, or fertility threat. Patient has stable vital signs, pain is well controlled, patient is ambulatory and p.o. tolerant.  Discharge instructions were completed using the EPIC system. I would refer you to those at this time. All warnings prescriptions follow-up etc. were discussed in detail with the patient. Patient indicates understanding and is agreeable with this plan. All questions answered.  Patient is made aware that they may return to the emergency department for any worsening or new condition or for any other emergency.   -- Risk: 5 This patient has a high risk of morbidity due to further diagnostic testing or treatment. Rationale: This patient's evaluation and management involve a high risk of morbidity due to the potential severity of presenting symptoms, need for diagnostic testing, and/or initiation of treatment that may require close monitoring. The differential includes conditions with potential for significant deterioration or requiring escalation of care. Treatment decisions in the ED, including medication administration, procedural interventions, or disposition planning, reflect this level of risk. COPA: 5 The patient has the following acute or chronic illness/injury that poses a possible threat to life or bodily function: [X] : The patient has a potentially serious acute condition or an acute exacerbation of a chronic illness requiring urgent evaluation and management in the Emergency Department. The clinical presentation necessitates immediate consideration of life-threatening or function-threatening diagnoses, even if they are ultimately ruled out.   FINAL CLINICAL IMPRESSION(S) / ED DIAGNOSES   Final diagnoses:  Flank pain  Nephrolithiasis  Intractable nausea     Rx / DC Orders   ED Discharge Orders          Ordered     oxyCODONE -acetaminophen  (PERCOCET) 5-325 MG tablet  Every 4 hours PRN        10/30/23 1406    ondansetron  (ZOFRAN -ODT) 4 MG disintegrating tablet  Every 8 hours PRN        10/30/23 1406    tamsulosin  (FLOMAX ) 0.4 MG CAPS capsule  Daily        10/30/23 1406    promethazine  (PHENERGAN ) 25 MG suppository  Every 6 hours PRN        10/30/23 1406  Note:  This document was prepared using Dragon voice recognition software and may include unintentional dictation errors.   Nicholaus Rolland BRAVO, MD 10/30/23 1534

## 2023-10-30 NOTE — ED Notes (Signed)
 Pt vomiting, and called out for pain increasing.

## 2023-10-30 NOTE — Discharge Instructions (Addendum)
 You were seen in the emergency department for right-sided flank pain.  Workup today demonstrated a small kidney stone while at the precipitous of the bladder.  No acute other findings were identified.  You received a large amount of medication for pain control and for nausea.  You should not drive or operate machinery for the next 48 hours.  I have sent some medication to your pharmacy of choice.  Please do not drive if you take the Percocet.  Please follow-up with your primary care physician soon as possible.  You can also follow-up with your urologist (call to make an appointment) as needed.  Return with any acutely worsening symptoms or any other emergency -- RETURN PRECAUTIONS & AFTERCARE: (ENGLISH) RETURN PRECAUTIONS: Return immediately to the emergency department or see/call your doctor if you feel worse, weak or have changes in speech or vision, are short of breath, have fever, vomiting, pain, bleeding or dark stool, trouble urinating or any new issues. Return here or see/call your doctor if not improving as expected for your suspected condition. FOLLOW-UP CARE: Call your doctor and/or any doctors we referred you to for more advice and to make an appointment. Do this today, tomorrow or after the weekend. Some doctors only take PPO insurance so if you have HMO insurance you may want to contact your HMO or your regular doctor for referral to a specialist within your plan. Either way tell the doctor's office that it was a referral from the emergency department so you get the soonest possible appointment.  YOUR TEST RESULTS: Take result reports of any blood or urine tests, imaging tests and EKG's to your doctor and any referral doctor. Have any abnormal tests repeated. Your doctor or a referral doctor can let you know when this should be done. Also make sure your doctor contacts this hospital to get any test results that are not currently available such as cultures or special tests for infection and final  imaging reports, which are often not available at the time you leave the ER but which may list additional important findings that are not documented on the preliminary report. BLOOD PRESSURE: If your blood pressure was greater than 120/80 have your blood pressure rechecked within 1 to 2 weeks. MEDICATION SIDE EFFECTS: Do not drive, walk, bike, take the bus, etc. if you have received or are being prescribed any sedating medications such as those for pain or anxiety or certain antihistamines like Benadryl. If you have been give one of these here get a taxi home or have a friend drive you home. Ask your pharmacist to counsel you on potential side effects of any new medication

## 2023-10-30 NOTE — ED Triage Notes (Addendum)
 Pt to ED via POV from home. Pt reports right flank pain that started this am. Pt reports hx of kidney stones. Pt reports N/V and urinary retention. Pt yelling of pain in triage. Pt is diaphoretic

## 2023-11-22 ENCOUNTER — Other Ambulatory Visit: Payer: Self-pay | Admitting: Urology

## 2023-11-23 NOTE — Patient Instructions (Signed)
 SURGICAL WAITING ROOM VISITATION  Patients having surgery or a procedure may have no more than 2 support people in the waiting area - these visitors may rotate.    Children under the age of 53 must have an adult with them who is not the patient.  Visitors with respiratory illnesses are discouraged from visiting and should remain at home.  If the patient needs to stay at the hospital during part of their recovery, the visitor guidelines for inpatient rooms apply. Pre-op nurse will coordinate an appropriate time for 1 support person to accompany patient in pre-op.  This support person may not rotate.    Please refer to the [facility] website for the visitor guidelines for Inpatients (after your surgery is over and you are in a regular room).    Your procedure is scheduled on: 11/29/23   Report to [facility] Main Entrance    Report to admitting at 7:45 AM   Call this number if you have problems the morning of surgery [phone]   Do not eat food or drink liquids :After Midnight.          If you have questions, please contact your surgeon's office.   FOLLOW BOWEL PREP AND ANY ADDITIONAL PRE OP INSTRUCTIONS YOU RECEIVED FROM YOUR SURGEON'S OFFICE!!!     Oral Hygiene is also important to reduce your risk of infection.                                    Remember - BRUSH YOUR TEETH THE MORNING OF SURGERY WITH YOUR REGULAR TOOTHPASTE  DENTURES WILL BE REMOVED PRIOR TO SURGERY PLEASE DO NOT APPLY Poly grip OR ADHESIVES!!!   Stop all vitamins and herbal supplements 7 days before surgery.   Take these medicines the morning of surgery with A SIP OF WATER: None                              You may not have any metal on your body including jewelry, and body piercing             Do not wear lotions, powders, cologne, or deodorant              Men may shave face and neck.   Do not bring valuables to the hospital. [facility] IS NOT             RESPONSIBLE   FOR  VALUABLES.   Contacts, glasses, dentures or bridgework may not be worn into surgery.  DO NOT BRING YOUR HOME MEDICATIONS TO THE HOSPITAL. PHARMACY WILL DISPENSE MEDICATIONS LISTED ON YOUR MEDICATION LIST TO YOU DURING YOUR ADMISSION IN THE HOSPITAL!    Patients discharged on the day of surgery will not be allowed to drive home.  Someone NEEDS to stay with you for the first 24 hours after anesthesia.              Please read over the following fact sheets you were given: IF YOU HAVE QUESTIONS ABOUT YOUR PRE-OP INSTRUCTIONS PLEASE CALL [phone].   If you received a COVID test during your pre-op visit  it is requested that you wear a mask when out in public, stay away from anyone that may not be feeling well and notify your surgeon if you develop symptoms. If you test positive for Covid or have been in contact  with anyone that has tested positive in the last 10 days please notify you surgeon.    [facility] - Preparing for Surgery Before surgery, you can play an important role.  Because skin is not sterile, your skin needs to be as free of germs as possible.  You can reduce the number of germs on your skin by washing with CHG (chlorahexidine gluconate) soap before surgery.  CHG is an antiseptic cleaner which kills germs and bonds with the skin to continue killing germs even after washing. Please DO NOT use if you have an allergy to CHG or antibacterial soaps.  If your skin becomes reddened/irritated stop using the CHG and inform your nurse when you arrive at Short Stay. Do not shave (including legs and underarms) for at least 48 hours prior to the first CHG shower.  You may shave your face/neck.  Please follow these instructions carefully:  1.  Shower with CHG Soap the night before surgery and the morning of surgery.  2.  If you choose to wash your hair, wash your hair first as usual with your normal  shampoo.  3.  After you shampoo, rinse your hair and body thoroughly to remove the  shampoo.                             4.  Use CHG as you would any other liquid soap.  You can apply chg directly to the skin and wash.  Gently with a scrungie or clean washcloth.  5.  Apply the CHG Soap to your body ONLY FROM THE NECK DOWN.   Do   not use on face/ open                           Wound or open sores. Avoid contact with eyes, ears mouth and   genitals (private parts).                       Wash face,  Genitals (private parts) with your normal soap.             6.  Wash thoroughly, paying special attention to the area where your    surgery  will be performed.  7.  Thoroughly rinse your body with warm water from the neck down.  8.  DO NOT shower/wash with your normal soap after using and rinsing off the CHG Soap.                9.  Pat yourself dry with a clean towel.            10.  Wear clean pajamas.            11.  Place clean sheets on your bed the night of your first shower and do not  sleep with pets. Day of Surgery : Do not apply any CHG, lotions/deodorants the morning of surgery.  Please wear clean clothes to the hospital/surgery center.  FAILURE TO FOLLOW THESE INSTRUCTIONS MAY RESULT IN THE CANCELLATION OF YOUR SURGERY  PATIENT SIGNATURE_________________________________  NURSE SIGNATURE__________________________________  ________________________________________________________________________

## 2023-11-24 ENCOUNTER — Encounter (HOSPITAL_COMMUNITY): Payer: Self-pay

## 2023-11-24 ENCOUNTER — Encounter (HOSPITAL_COMMUNITY)
Admission: RE | Admit: 2023-11-24 | Discharge: 2023-11-24 | Disposition: A | Discharge status: Home / Self Care | Source: Ambulatory Visit | Attending: Urology | Admitting: Urology

## 2023-11-24 ENCOUNTER — Other Ambulatory Visit: Payer: Self-pay

## 2023-11-24 VITALS — BP 133/84 | HR 83 | Temp 98.0°F | Resp 18 | Ht 71.0 in | Wt 249.0 lb

## 2023-11-24 DIAGNOSIS — Z01818 Encounter for other preprocedural examination: Secondary | ICD-10-CM

## 2023-11-24 DIAGNOSIS — Z01812 Encounter for preprocedural laboratory examination: Secondary | ICD-10-CM | POA: Insufficient documentation

## 2023-11-24 HISTORY — DX: Personal history of urinary calculi: Z87.442

## 2023-11-24 LAB — CBC
HCT: 45.4 % (ref 39.0–52.0)
Hemoglobin: 15.4 g/dL (ref 13.0–17.0)
MCH: 30 pg (ref 26.0–34.0)
MCHC: 33.9 g/dL (ref 30.0–36.0)
MCV: 88.3 fL (ref 80.0–100.0)
Platelets: 259 K/uL (ref 150–400)
RBC: 5.14 MIL/uL (ref 4.22–5.81)
RDW: 13.2 % (ref 11.5–15.5)
WBC: 7.3 K/uL (ref 4.0–10.5)
nRBC: 0 % (ref 0.0–0.2)

## 2023-11-24 NOTE — Progress Notes (Signed)
 COVID Vaccine Completed:  Date of COVID positive in last 90 days:  PCP - not currently  Cardiologist - n/a  Chest x-ray - N/A EKG - N/A Stress Test - N/A ECHO - N/A Cardiac Cath - n/a Pacemaker/ICD device last checked:N/A Spinal Cord Stimulator:N/A  Bowel Prep - N/A  Sleep Study - N/A CPAP -   Fasting Blood Sugar - N/A Checks Blood Sugar _____ times a day  Last dose of GLP1 agonist-  N/A GLP1 instructions:  Do not take after     Last dose of SGLT-2 inhibitors-  N/A SGLT-2 instructions:  Do not take after     Blood Thinner Instructions: N/A Last dose:   Time: Aspirin Instructions:N/A Last Dose:  Activity level: Can go up a flight of stairs and perform activities of daily living without stopping and without symptoms of chest pain or shortness of breath.  Anesthesia review: N/A  Patient denies shortness of breath, fever, cough and chest pain at PAT appointment  Patient verbalized understanding of instructions that were given to them at the PAT appointment. Patient was also instructed that they will need to review over the PAT instructions again at home before surgery.

## 2023-11-29 ENCOUNTER — Ambulatory Visit (HOSPITAL_COMMUNITY)

## 2023-11-29 ENCOUNTER — Ambulatory Visit (HOSPITAL_COMMUNITY): Payer: Self-pay | Admitting: Physician Assistant

## 2023-11-29 ENCOUNTER — Encounter (HOSPITAL_COMMUNITY): Payer: Self-pay | Admitting: Urology

## 2023-11-29 ENCOUNTER — Encounter (HOSPITAL_COMMUNITY)
Admission: RE | Disposition: A | Discharge status: Home / Self Care | Payer: Self-pay | Source: Ambulatory Visit | Attending: Urology

## 2023-11-29 ENCOUNTER — Ambulatory Visit (HOSPITAL_COMMUNITY)
Admission: RE | Admit: 2023-11-29 | Discharge: 2023-11-29 | Disposition: A | Discharge status: Home / Self Care | Source: Ambulatory Visit | Attending: Urology | Admitting: Urology

## 2023-11-29 ENCOUNTER — Ambulatory Visit (HOSPITAL_COMMUNITY): Payer: Self-pay | Admitting: Certified Registered"

## 2023-11-29 DIAGNOSIS — Z538 Procedure and treatment not carried out for other reasons: Secondary | ICD-10-CM | POA: Insufficient documentation

## 2023-11-29 DIAGNOSIS — N200 Calculus of kidney: Secondary | ICD-10-CM | POA: Diagnosis not present

## 2023-11-29 HISTORY — PX: CYSTOSCOPY/URETEROSCOPY/HOLMIUM LASER/STENT PLACEMENT: SHX6546

## 2023-11-29 SURGERY — CYSTOSCOPY/URETEROSCOPY/HOLMIUM LASER/STENT PLACEMENT
Anesthesia: General | Site: Bladder | Laterality: Left

## 2023-11-29 MED ORDER — ORAL CARE MOUTH RINSE: 15.0000 mL | Freq: Once | OROMUCOSAL | Status: AC

## 2023-11-29 MED ORDER — PROPOFOL 10 MG/ML IV BOLUS
INTRAVENOUS | Status: DC | PRN
  Administered 2023-11-29: 200 mg via INTRAVENOUS

## 2023-11-29 MED ORDER — HYOSCYAMINE SULFATE 0.125 MG PO TABS: 0.1250 mg | ORAL_TABLET | Freq: Four times a day (QID) | ORAL | 0 refills | Status: AC | PRN

## 2023-11-29 MED ORDER — IOHEXOL 300 MG/ML  SOLN
INTRAMUSCULAR | Status: DC | PRN
  Administered 2023-11-29: 5 mL

## 2023-11-29 MED ORDER — CHLORHEXIDINE GLUCONATE 0.12 % MT SOLN
15.0000 mL | Freq: Once | OROMUCOSAL | Status: AC
  Administered 2023-11-29: 15 mL via OROMUCOSAL

## 2023-11-29 MED ORDER — DEXAMETHASONE SOD PHOSPHATE PF 10 MG/ML IJ SOLN
INTRAMUSCULAR | Status: DC | PRN
Start: 2023-11-29 — End: 2023-11-29
  Administered 2023-11-29: 8 mg via INTRAVENOUS

## 2023-11-29 MED ORDER — ACETAMINOPHEN 10 MG/ML IV SOLN
1000.0000 mg | Freq: Once | INTRAVENOUS | Status: AC
Start: 2023-11-29 — End: 2023-11-29
  Administered 2023-11-29: 1000 mg via INTRAVENOUS

## 2023-11-29 MED ORDER — MIDAZOLAM HCL 2 MG/2ML IJ SOLN
INTRAMUSCULAR | Status: AC
  Filled 2023-11-29: qty 2

## 2023-11-29 MED ORDER — CELECOXIB 200 MG PO CAPS: 200.0000 mg | ORAL_CAPSULE | Freq: Two times a day (BID) | ORAL | 1 refills | Status: AC

## 2023-11-29 MED ORDER — DEXMEDETOMIDINE HCL IN NACL 80 MCG/20ML IV SOLN
INTRAVENOUS | Status: AC
  Filled 2023-11-29: qty 20

## 2023-11-29 MED ORDER — DROPERIDOL 2.5 MG/ML IJ SOLN
INTRAMUSCULAR | Status: AC
  Filled 2023-11-29: qty 2

## 2023-11-29 MED ORDER — OXYCODONE HCL 5 MG PO TABS
5.0000 mg | ORAL_TABLET | Freq: Once | ORAL | Status: AC
  Administered 2023-11-29: 5 mg via ORAL

## 2023-11-29 MED ORDER — DEXMEDETOMIDINE HCL IN NACL 80 MCG/20ML IV SOLN
INTRAVENOUS | Status: DC | PRN
  Administered 2023-11-29: 4 ug via INTRAVENOUS

## 2023-11-29 MED ORDER — LIDOCAINE HCL (CARDIAC) PF 100 MG/5ML IV SOSY
PREFILLED_SYRINGE | INTRAVENOUS | Status: DC | PRN
  Administered 2023-11-29: 100 mg via INTRAVENOUS

## 2023-11-29 MED ORDER — FENTANYL CITRATE (PF) 100 MCG/2ML IJ SOLN
INTRAMUSCULAR | Status: AC
  Filled 2023-11-29: qty 2

## 2023-11-29 MED ORDER — FENTANYL CITRATE (PF) 50 MCG/ML IJ SOSY
25.0000 ug | PREFILLED_SYRINGE | INTRAMUSCULAR | Status: DC | PRN
  Administered 2023-11-29: 50 ug via INTRAVENOUS

## 2023-11-29 MED ORDER — DROPERIDOL 2.5 MG/ML IJ SOLN
0.6250 mg | Freq: Once | INTRAMUSCULAR | Status: AC | PRN
  Administered 2023-11-29: 0.625 mg via INTRAVENOUS

## 2023-11-29 MED ORDER — SODIUM CHLORIDE 0.9 % IR SOLN
Status: DC | PRN
  Administered 2023-11-29: 6000 mL via INTRAVESICAL

## 2023-11-29 MED ORDER — LACTATED RINGERS IV SOLN: INTRAVENOUS | Status: DC

## 2023-11-29 MED ORDER — TRAMADOL HCL 50 MG PO TABS: 50.0000 mg | ORAL_TABLET | Freq: Four times a day (QID) | ORAL | 0 refills | Status: AC | PRN

## 2023-11-29 MED ORDER — SULFAMETHOXAZOLE-TRIMETHOPRIM 800-160 MG PO TABS: 1.0000 | ORAL_TABLET | Freq: Two times a day (BID) | ORAL | 0 refills | Status: DC

## 2023-11-29 MED ORDER — MIDAZOLAM HCL (PF) 2 MG/2ML IJ SOLN
INTRAMUSCULAR | Status: DC | PRN
  Administered 2023-11-29: 2 mg via INTRAVENOUS

## 2023-11-29 MED ORDER — FENTANYL CITRATE (PF) 50 MCG/ML IJ SOSY
PREFILLED_SYRINGE | INTRAMUSCULAR | Status: AC
  Filled 2023-11-29: qty 1

## 2023-11-29 MED ORDER — ONDANSETRON HCL 4 MG/2ML IJ SOLN
INTRAMUSCULAR | Status: AC
  Filled 2023-11-29: qty 2

## 2023-11-29 MED ORDER — FENTANYL CITRATE (PF) 100 MCG/2ML IJ SOLN
INTRAMUSCULAR | Status: DC | PRN
  Administered 2023-11-29 (×2): 25 ug via INTRAVENOUS
  Administered 2023-11-29: 50 ug via INTRAVENOUS
  Administered 2023-11-29: 25 ug via INTRAVENOUS

## 2023-11-29 MED ORDER — ACETAMINOPHEN 500 MG PO TABS: 1000.0000 mg | ORAL_TABLET | Freq: Once | ORAL | Status: DC

## 2023-11-29 MED ORDER — PROPOFOL 10 MG/ML IV BOLUS
INTRAVENOUS | Status: AC
  Filled 2023-11-29: qty 20

## 2023-11-29 MED ORDER — ONDANSETRON HCL 4 MG/2ML IJ SOLN
INTRAMUSCULAR | Status: DC | PRN
  Administered 2023-11-29: 4 mg via INTRAVENOUS

## 2023-11-29 MED ORDER — OXYCODONE HCL 5 MG PO TABS
ORAL_TABLET | ORAL | Status: AC
  Filled 2023-11-29: qty 1

## 2023-11-29 MED ORDER — CEFAZOLIN SODIUM-DEXTROSE 2-4 GM/100ML-% IV SOLN
2.0000 g | INTRAVENOUS | Status: AC
Start: 2023-11-29 — End: 2023-11-29
  Administered 2023-11-29: 2 g via INTRAVENOUS
  Filled 2023-11-29: qty 100

## 2023-11-29 SURGICAL SUPPLY — 21 items
BAG COUNTER SPONGE SURGICOUNT (BAG) IMPLANT
BAG URO CATCHER STRL LF (MISCELLANEOUS) ×1 IMPLANT
BASKET ZERO TIP NITINOL 2.4FR (BASKET) IMPLANT
CATH URETERAL DUAL LUMEN 10F (MISCELLANEOUS) ×1 IMPLANT
CATH URETL OPEN END 6FR 70 (CATHETERS) IMPLANT
CLOTH BEACON ORANGE TIMEOUT ST (SAFETY) ×1 IMPLANT
EXTRACTOR STONE NITINOL NGAGE (UROLOGICAL SUPPLIES) IMPLANT
GLOVE SS BIOGEL STRL SZ 7 (GLOVE) ×1 IMPLANT
GOWN STRL REUS W/ TWL XL LVL3 (GOWN DISPOSABLE) ×1 IMPLANT
GUIDEWIRE STR DUAL SENSOR (WIRE) IMPLANT
GUIDEWIRE ZIPWRE .038 STRAIGHT (WIRE) ×1 IMPLANT
IV NS 1000ML BAXH (IV SOLUTION) ×1 IMPLANT
KIT TURNOVER KIT A (KITS) ×1 IMPLANT
MANIFOLD NEPTUNE II (INSTRUMENTS) ×1 IMPLANT
PACK CYSTO (CUSTOM PROCEDURE TRAY) ×1 IMPLANT
SHEATH NAVIGATOR HD 11/13X28 (SHEATH) IMPLANT
SHEATH NAVIGATOR HD 11/13X36 (SHEATH) IMPLANT
STENT URET 6FRX26 CONTOUR (STENTS) IMPLANT
TRACTIP FLEXIVA PULS ID 200XHI (Laser) IMPLANT
TUBING CONNECTING 10 (TUBING) ×1 IMPLANT
TUBING UROLOGY SET (TUBING) ×1 IMPLANT

## 2023-11-29 NOTE — Op Note (Signed)
 Operative Note  Preoperative diagnosis:  1.  Left renal stone  Postoperative diagnosis: 1.  same  Procedure(s): 1.  Left ureteroscopy 2. Left retrograde pyelogram 3. Left ureteral stent placement ([id number])  Surgeon: Herlene Foot, MD  Assistants:  None  Anesthesia:  General  Complications:  None  EBL: Minimal  Specimens: 1. none  Drains/Catheters: 1.  Left 6Fr x 26cm ureteral stent no string  Intraoperative findings:   Cystoscopy demonstrated unremarkable bladder Left Ureteroscopy demonstrated large stone in left lower pole. I was not able to articulate the ureteroscope so as to be able to safely laser the stone. I attempted to move the stone with 0tip and N-gage baskets but was unable. Subsequently I placed a stent and terminated the procedure Successful stent placement.  Left Retrograde Pyelogram: large lower pole stone seen on scout film on KUB. No hydronephrosis, filling defect in lower pole consistent with stone.    Description of procedure: After informed consent was obtained from the patient, the patient was identified and taken to the operating room and placed in the supine position.  General anesthesia was administered as well as perioperative IV antibiotics.  At the beginning of the case, a time-out was performed to properly identify the patient, the surgery to be performed, and the surgical site.  Sequential compression devices were applied to the lower extremities at the beginning of the case for DVT prophylaxis.  The patient was then placed in the dorsal lithotomy supine position, prepped and draped in sterile fashion.  We then passed the 21-French rigid cystoscope through the urethra and into the bladder under vision without any difficulty, noting a mildly obstructing prostate.  A systematic evaluation of the bladder revealed no evidence of any suspicious bladder lesions.  Ureteral orifices were in normal position.    we then passed a 0.038 glide wire up to the  level of the renal pelvis.  The cystoscope was withdrawn, and a dual lumen catheter was inserted over the glide wire into the distal ureter. A gentle retrograde pyelogram was performed, revealing a normal caliber ureter without any filling defects. Findings as above. A 0.038 sensor wire was then passed up to the level of the renal pelvis and secured to the drape as a safety wire. The dual lumen was removed.  An 11/13Fr ureteral access sheath was carefully advanced up the ureter to the level of the UPJ over this wire under fluoroscopic guidance. The flexible ureteroscope was advanced into the collecting system via the access sheath. The collecting system was inspected. The calculus was identified in the lower pole. I inserted a laser fiber but given the location of the stone could not articulate the camera so as to safely laser the stone. I used both 0-tip and n-gage baskets to try to grab and move the stone, but was unable to manipulate these so as to grab the stone. Ultimately, I felt it prudent to terminate the procedure and place a stent.   We then withdrew the ureteroscope back down the ureter along with the access sheath, noting no evidence of any stones along the course of the ureter.  Prior to removing the ureteroscope, we did pass the Glidewire back up to the ureter to the renal pelvis.     Once the ureteroscope was removed, the Glidewire was backloaded through the rigid cystoscope, which was then advanced down the urethra and into the bladder. We then used the Glidewire under direct vision through the rigid cystoscope and under fluoroscopic guidance and passed up  a 6-French, 26 cm double-pigtail ureteral stent up ureter, making sure that the proximal and distal ends coiled within the kidney and bladder respectively.   The patient tolerated the procedure well and there was no complication. Patient was awoken from anesthesia and taken to the recovery room in stable condition. I was present and  scrubbed for the entirety of the case.  Plan:  Patient will be discharged home. Discussed likely next best step would be PCNL and provided an overview of this to patient's wife; will refer to one of my partners to set up.    JUDITHANN Herlene Foot MD [facility]  Pager: [phone]

## 2023-11-29 NOTE — Transfer of Care (Signed)
 Immediate Anesthesia Transfer of Care Note  Patient: Billy Willis  Procedure(s) Performed: CYSTOSCOPY/URETEROSCOPY/HOLMIUM LASER/STENT PLACEMENT (Left: Bladder)  Patient Location: PACU  Anesthesia Type:General  Level of Consciousness: awake, alert , and oriented  Airway & Oxygen Therapy: Patient Spontanous Breathing and Patient connected to face mask oxygen  Post-op Assessment: Report given to RN and Post -op Vital signs reviewed and stable  Post vital signs: Reviewed and stable  Last Vitals:  Vitals Value Taken Time  BP 114/72 11/29/23 11:46  Temp 36.1 C 11/29/23 11:46  Pulse 56 11/29/23 11:48  Resp 11 11/29/23 11:48  SpO2 90 % 11/29/23 11:48  Vitals shown include unfiled device data.  Last Pain:  Vitals:   11/29/23 1146  TempSrc:   PainSc: 0-No pain         Complications: No notable events documented.

## 2023-11-29 NOTE — Discharge Instructions (Signed)

## 2023-11-29 NOTE — Anesthesia Procedure Notes (Signed)
 Procedure Name: LMA Insertion Date/Time: 11/29/2023 10:35 AM  Performed by: Metta Andrea NOVAK, CRNAPre-anesthesia Checklist: Patient identified, Emergency Drugs available, Suction available, Patient being monitored and Timeout performed Patient Re-evaluated:Patient Re-evaluated prior to induction Oxygen Delivery Method: Circle system utilized Preoxygenation: Pre-oxygenation with 100% oxygen Induction Type: IV induction Ventilation: Mask ventilation without difficulty LMA: LMA inserted LMA Size: 4.0 Number of attempts: 1 Placement Confirmation: positive ETCO2 Tube secured with: Tape Dental Injury: Teeth and Oropharynx as per pre-operative assessment

## 2023-11-29 NOTE — H&P (Signed)
 H&P  History of Present Illness: Billy Willis is a 43 y.o. year old M who presents today for treatment of a left renal stone  No acute complaints  Past Medical History:  Diagnosis Date   Elevated bilirubin    Fatty liver disease, nonalcoholic    History of chicken pox    History of kidney stones    Kidney stones 02/08/2013   Reticulocytosis     Past Surgical History:  Procedure Laterality Date   COLONOSCOPY WITH PROPOFOL  N/A 03/04/2020   Procedure: COLONOSCOPY WITH PROPOFOL ;  Surgeon: Jinny Carmine, MD;  Location: [facility] END[facility]COPY;  Service: Endoscopy;  Laterality: N/A;  COVID POSITIVE 02/01/2021   CYSTOSCOPY W/ URETEROSCOPY W/ LITHOTRIPSY  2010   WISDOM TOOTH EXTRACTION      Home Medications:  Current Meds  Medication Sig   ondansetron  (ZOFRAN -ODT) 4 MG disintegrating tablet Take 1 tablet (4 mg total) by mouth every 8 (eight) hours as needed for nausea or vomiting.    Allergies: No Known Allergies  Family History  Problem Relation Age of Onset   Cancer Mother        ovary/uterine cancer   Mental illness Father    Early death Father    Heart attack Father 44   Arthritis Maternal Grandmother    Arthritis Maternal Grandfather    Stroke Paternal Grandmother    Stroke Paternal Grandfather     Social History:  reports that he has never smoked. He has never used smokeless tobacco. He reports that he does not currently use alcohol. He reports that he does not use drugs.  ROS: A complete review of systems was performed.  All systems are negative except for pertinent findings as noted.  Physical Exam:  Vital signs in last 24 hours: Temp:  [98.5 F (36.9 C)] 98.5 F (36.9 C) (10/21 0807) Pulse Rate:  [75] 75 (10/21 0807) Resp:  [16] 16 (10/21 0807) BP: (139)/(91) 139/91 (10/21 0807) SpO2:  [96 %] 96 % (10/21 0807) Weight:  [112.9 kg] 112.9 kg (10/21 9187) Constitutional:  Alert and oriented, No acute distress Cardiovascular: Regular rate and rhythm Respiratory:  Normal respiratory effort, Lungs clear bilaterally GI: Abdomen is soft, nontender, nondistended, no abdominal masses Lymphatic: No lymphadenopathy Neurologic: Grossly intact, no focal deficits Psychiatric: Normal mood and affect   Laboratory Data:  No results for input(s): WBC, HGB, HCT, PLT in the last 72 hours.  No results for input(s): NA, K, CL, GLUCOSE, BUN, CALCIUM, CREATININE in the last 72 hours.  Invalid input(s): CO3   No results found for this or any previous visit (from the past 24 hours). No results found for this or any previous visit (from the past 240 hours).  Renal Function: No results for input(s): CREATININE in the last 168 hours. CrCl cannot be calculated (Patient's most recent lab result is older than the maximum 21 days allowed.).  Radiologic Imaging: No results found.  Assessment:  Billy Willis is a 43 y.o. year old M with left renal stone   Plan:  --to OR as planned for left ureteroscopy with laser litho, stent. Procedure and risks reviewed, including but not limited to hematuria, infection, sepsis, damage to GU tract, failure to complete procedure, retained stone fragments, need for future procedures, stent pain, prolonged stent.   Herlene Foot, MD 11/29/2023, 10:20 AM  [facility] Pager: [phone]

## 2023-11-29 NOTE — Anesthesia Postprocedure Evaluation (Signed)
 Anesthesia Post Note  Patient: Billy Willis  Procedure(s) Performed: CYSTOSCOPY/URETEROSCOPY/HOLMIUM LASER/STENT PLACEMENT (Left: Bladder)     Patient location during evaluation: PACU Anesthesia Type: General Level of consciousness: awake and alert Pain management: pain level controlled Vital Signs Assessment: post-procedure vital signs reviewed and stable Respiratory status: spontaneous breathing, nonlabored ventilation, respiratory function stable and patient connected to nasal cannula oxygen Cardiovascular status: blood pressure returned to baseline and stable Postop Assessment: no apparent nausea or vomiting Anesthetic complications: no   No notable events documented.  Last Vitals:  Vitals:   11/29/23 1330 11/29/23 1400  BP: 126/67 122/80  Pulse: 65 61  Resp: 18 18  Temp:  36.4 C  SpO2: 94% 96%    Last Pain:  Vitals:   11/29/23 1400  TempSrc:   PainSc: 3                  Julius Matus P Roney Youtz

## 2023-11-29 NOTE — Anesthesia Preprocedure Evaluation (Signed)
 Anesthesia Evaluation  Patient identified by MRN, date of birth, ID band Patient awake    Reviewed: Allergy & Precautions, NPO status , Patient's Chart, lab work & pertinent test results  Airway Mallampati: II  TM Distance: >3 FB Neck ROM: Full    Dental no notable dental hx.    Pulmonary neg pulmonary ROS   Pulmonary exam normal        Cardiovascular negative cardio ROS  Rhythm:Regular Rate:Normal     Neuro/Psych negative neurological ROS  negative psych ROS   GI/Hepatic negative GI ROS, Neg liver ROS,,,  Endo/Other  negative endocrine ROS    Renal/GU Renal disease  negative genitourinary   Musculoskeletal negative musculoskeletal ROS (+)    Abdominal Normal abdominal exam  (+)   Peds  Hematology Lab Results      Component                Value               Date                      WBC                      7.3                 11/24/2023                HGB                      15.4                11/24/2023                HCT                      45.4                11/24/2023                MCV                      88.3                11/24/2023                PLT                      259                 11/24/2023             Lab Results      Component                Value               Date                      NA                       137                 10/30/2023                K  3.9                 10/30/2023                CO2                      22                  10/30/2023                GLUCOSE                  157 (H)             10/30/2023                BUN                      12                  10/30/2023                CREATININE               0.90                10/30/2023                CALCIUM                  9.2                 10/30/2023                GFR                      108.62              02/05/2022                EGFR                     111                  12/09/2022                GFRNONAA                 >60                 10/30/2023              Anesthesia Other Findings   Reproductive/Obstetrics                              Anesthesia Physical Anesthesia Plan  ASA: 2  Anesthesia Plan: General   Post-op Pain Management: Tylenol  PO (pre-op)*   Induction: Intravenous  PONV Risk Score and Plan: 2 and Ondansetron , Dexamethasone, Midazolam  and Treatment may vary due to age or medical condition  Airway Management Planned: Mask and LMA  Additional Equipment: None  Intra-op Plan:   Post-operative Plan: Extubation in OR  Informed Consent: I have reviewed the patients History and Physical, chart, labs and discussed the procedure including the risks, benefits and alternatives for the proposed anesthesia with the patient or authorized representative who has indicated his/her understanding and acceptance.     Dental advisory given  Plan Discussed with: CRNA  Anesthesia  Plan Comments:         Anesthesia Quick Evaluation

## 2023-11-30 ENCOUNTER — Encounter (HOSPITAL_COMMUNITY): Payer: Self-pay | Admitting: Urology

## 2023-12-07 ENCOUNTER — Other Ambulatory Visit: Payer: Self-pay | Admitting: Urology

## 2023-12-12 ENCOUNTER — Encounter: Payer: Self-pay | Admitting: Radiology

## 2023-12-23 ENCOUNTER — Other Ambulatory Visit: Payer: Self-pay

## 2023-12-23 ENCOUNTER — Emergency Department
Admission: EM | Admit: 2023-12-23 | Discharge: 2023-12-23 | Disposition: A | Discharge status: Home / Self Care | Payer: Worker's Compensation | Attending: Emergency Medicine | Admitting: Emergency Medicine

## 2023-12-23 DIAGNOSIS — Y990 Civilian activity done for income or pay: Secondary | ICD-10-CM | POA: Diagnosis not present

## 2023-12-23 DIAGNOSIS — X500XXA Overexertion from strenuous movement or load, initial encounter: Secondary | ICD-10-CM | POA: Insufficient documentation

## 2023-12-23 DIAGNOSIS — S3992XA Unspecified injury of lower back, initial encounter: Secondary | ICD-10-CM | POA: Diagnosis present

## 2023-12-23 DIAGNOSIS — S39012A Strain of muscle, fascia and tendon of lower back, initial encounter: Secondary | ICD-10-CM | POA: Diagnosis not present

## 2023-12-23 MED ORDER — KETOROLAC TROMETHAMINE 30 MG/ML IJ SOLN
30.0000 mg | Freq: Once | INTRAMUSCULAR | Status: AC
  Administered 2023-12-23: 30 mg via INTRAMUSCULAR
  Filled 2023-12-23: qty 1

## 2023-12-23 MED ORDER — LIDOCAINE 5 % EX PTCH: 1.0000 | MEDICATED_PATCH | Freq: Two times a day (BID) | CUTANEOUS | 0 refills | Status: AC | PRN

## 2023-12-23 MED ORDER — LIDOCAINE 5 % EX PTCH
1.0000 | MEDICATED_PATCH | Freq: Once | CUTANEOUS | Status: DC
  Administered 2023-12-23: 1 via TRANSDERMAL
  Filled 2023-12-23: qty 1

## 2023-12-23 MED ORDER — CYCLOBENZAPRINE HCL 10 MG PO TABS
10.0000 mg | ORAL_TABLET | Freq: Once | ORAL | Status: AC
  Administered 2023-12-23: 10 mg via ORAL
  Filled 2023-12-23: qty 1

## 2023-12-23 MED ORDER — CYCLOBENZAPRINE HCL 5 MG PO TABS: 5.0000 mg | ORAL_TABLET | Freq: Three times a day (TID) | ORAL | 0 refills | Status: AC | PRN

## 2023-12-23 NOTE — Discharge Instructions (Addendum)
 Your exam is reassuring at this time.  No signs of any serious spinal cord injury or nerve compression.  I reviewed her prior CT scan, and it does show some mild degenerative disc disease at L4-5 and L5-S1.  You should take your prescription meds as directed along with your previously prescribed ibuprofen 3 times daily with food as directed.  Caution drowsiness with the muscle relaxant.  Follow-up with local urgent care for ongoing evaluation.  Return to the ED if needed.

## 2023-12-23 NOTE — ED Triage Notes (Signed)
 Pt to ED via POV for c/o lower back pain that began yesterday. Pt was doing heavy lifting. Pain began suddenly after bending over to pick something up. Pt initially able to walk, states difficulty walking now. Pt took 800 mg ibuprofen at midnight.

## 2023-12-23 NOTE — ED Provider Notes (Signed)
 [facility] Emergency Department Provider Note     Event Date/Time   First MD Initiated Contact with Patient 12/23/23 1008     (approximate)   History   Back Pain   HPI  Billy Willis is a 43 y.o. male with a history of kidney stones presents to the ED endorsing acute low back pain.  Patient reports onset of symptoms yesterday, after doing some heavy lifting at work.  He bent over to pick up something relatively light, and had immediate strain and spasm to the lower back.  Since that time he had difficulty walking and changing positions.  He denies any outright fall, bladder or bowel incontinence, foot drop, or saddle anesthesia.  Patient has taken at least 2 doses of 800 mg ibuprofen in the last 8 to 12 hours.  He presents to the ED for evaluation of his injuries.  He denies any other injury at this time.  Physical Exam   Triage Vital Signs: ED Triage Vitals  Encounter Vitals Group     BP 12/23/23 0918 125/85     Girls Systolic BP Percentile --      Girls Diastolic BP Percentile --      Boys Systolic BP Percentile --      Boys Diastolic BP Percentile --      Pulse Rate 12/23/23 0918 86     Resp 12/23/23 0918 18     Temp 12/23/23 0918 98.3 F (36.8 C)     Temp Source 12/23/23 0918 Oral     SpO2 12/23/23 0918 95 %     Weight 12/23/23 0921 245 lb (111.1 kg)     Height 12/23/23 0921 5' 11 (1.803 m)     Head Circumference --      Peak Flow --      Pain Score 12/23/23 0918 3     Pain Loc --      Pain Education --      Exclude from Growth Chart --     Most recent vital signs: Vitals:   12/23/23 0918  BP: 125/85  Pulse: 86  Resp: 18  Temp: 98.3 F (36.8 C)  SpO2: 95%    General Awake, no distress. NAD HEENT NCAT. PERRL. EOMI. No rhinorrhea. Mucous membranes are moist.  CV:  Good peripheral perfusion. RRR RESP:  Normal effort. CTA ABD:  No distention.  MSK:  Normal spinal alignment with about significant midline tenderness, spasm,  deformity, or step-off.  Patient with tenderness to palpation to the lumbar sacral paraspinal muscle sure of the lumbar sacral region.  He is able demonstrate normal range of motion and strength in lower extremities bilaterally. NEURO: Cranial nerves II to XII grossly intact.  Normal LE DTRs bilaterally.  Negative seated straight leg raise bilaterally.   ED Results / Procedures / Treatments   Labs (all labs ordered are listed, but only abnormal results are displayed) Labs Reviewed - No data to display   EKG   RADIOLOGY  I personally viewed and evaluated these images as part of my medical decision making, as well as reviewing the written report by the radiologist.  ED Provider Interpretation: Mild degenerative disc disease primarily at L4-5 and L5-S1   CT Renal Stone Study (10/30/23)  IMPRESSION: 1. Nonobstructive 2 mm right ureterovesicular junction stone. 2. Nonobstructive bilateral nephrolithiasis measuring up to 14 mm on the left and 5 mm on the right. 3. Colonic diverticulosis with no acute diverticulitis. 4. Hepatomegaly. 5. Bilateral fat containing inguinal  hernias.   PROCEDURES:  Critical Care performed: No  Procedures   MEDICATIONS ORDERED IN ED: Medications - No data to display   IMPRESSION / MDM / ASSESSMENT AND PLAN / ED COURSE  I reviewed the triage vital signs and the nursing notes.                              Differential diagnosis includes, but is not limited to, lumbar strain, lumbar radiculopathy, lumbar fracture, DDD  Patient's presentation is most consistent with acute complicated illness / injury requiring diagnostic workup.  Patient's diagnosis is consistent with acute lumbar strain.  He presents to the ED endorsing midline low back pain with intermittent muscle spasm and following strain.  No red flags on exam.  Patient able demonstrate normal straight leg raise on exam.  Reassuring neuroexam overall.  I reviewed recent CT stone images and  evaluated the patient's musculoskeletal system noting some mild DDD at the lower lumbar sacral region.  No indication on this presentation for any emergent imaging.  Patient treated with IM Toradol , p.o. Flexeril, and Lidoderm  patch.  He is endorsing significant movement of his symptom at the time of his interval evaluation.  Patient will be discharged home with prescriptions for Flexeril, Lidoderm  patches. Patient is to follow up with his PCP or local urgent care as suggested, as needed or otherwise directed. Patient is given ED precautions to return to the ED for any worsening or new symptoms.     FINAL CLINICAL IMPRESSION(S) / ED DIAGNOSES   Final diagnoses:  None  Acute lumbar strain   Rx / DC Orders   ED Discharge Orders     None        Note:  This document was prepared using Dragon voice recognition software and may include unintentional dictation errors.    Loyd Candida LULLA Aldona, PA-C 12/23/23 1132    Waymond Lorelle Cummins, MD 12/23/23 [phone]

## 2024-01-10 ENCOUNTER — Encounter (HOSPITAL_COMMUNITY)

## 2024-01-18 ENCOUNTER — Ambulatory Visit (HOSPITAL_COMMUNITY): Admit: 2024-01-18 | Admitting: Urology

## 2024-01-18 SURGERY — NEPHROLITHOTOMY PERCUTANEOUS
Anesthesia: General | Laterality: Right
